# Patient Record
Sex: Male | Born: 1935 | Race: White | Hispanic: No | Marital: Married | Smoking: Former smoker
Health system: Southern US, Community
[De-identification: ages and names within clinical notes are randomized; demographics above are authoritative.]

## PROBLEM LIST (undated history)

## (undated) DIAGNOSIS — Z8719 Personal history of other diseases of the digestive system: Secondary | ICD-10-CM

## (undated) DIAGNOSIS — Z953 Presence of xenogenic heart valve: Secondary | ICD-10-CM

## (undated) DIAGNOSIS — F419 Anxiety disorder, unspecified: Secondary | ICD-10-CM

## (undated) DIAGNOSIS — I509 Heart failure, unspecified: Secondary | ICD-10-CM

## (undated) DIAGNOSIS — I350 Nonrheumatic aortic (valve) stenosis: Secondary | ICD-10-CM

## (undated) DIAGNOSIS — M1990 Unspecified osteoarthritis, unspecified site: Secondary | ICD-10-CM

## (undated) DIAGNOSIS — Z8711 Personal history of peptic ulcer disease: Secondary | ICD-10-CM

## (undated) DIAGNOSIS — R011 Cardiac murmur, unspecified: Secondary | ICD-10-CM

## (undated) DIAGNOSIS — K219 Gastro-esophageal reflux disease without esophagitis: Secondary | ICD-10-CM

## (undated) DIAGNOSIS — G709 Myoneural disorder, unspecified: Secondary | ICD-10-CM

## (undated) DIAGNOSIS — Z951 Presence of aortocoronary bypass graft: Secondary | ICD-10-CM

## (undated) DIAGNOSIS — Z8679 Personal history of other diseases of the circulatory system: Secondary | ICD-10-CM

## (undated) DIAGNOSIS — I10 Essential (primary) hypertension: Secondary | ICD-10-CM

## (undated) DIAGNOSIS — I428 Other cardiomyopathies: Secondary | ICD-10-CM

## (undated) DIAGNOSIS — Z9889 Other specified postprocedural states: Secondary | ICD-10-CM

## (undated) DIAGNOSIS — I5022 Chronic systolic (congestive) heart failure: Secondary | ICD-10-CM

## (undated) DIAGNOSIS — I4891 Unspecified atrial fibrillation: Secondary | ICD-10-CM

## (undated) DIAGNOSIS — I2510 Atherosclerotic heart disease of native coronary artery without angina pectoris: Secondary | ICD-10-CM

## (undated) DIAGNOSIS — C801 Malignant (primary) neoplasm, unspecified: Secondary | ICD-10-CM

## (undated) HISTORY — DX: Chronic systolic (congestive) heart failure: I50.22

## (undated) HISTORY — PX: OTHER SURGICAL HISTORY: SHX169

## (undated) HISTORY — DX: Personal history of other diseases of the digestive system: Z87.19

## (undated) HISTORY — PX: EYE SURGERY: SHX253

## (undated) HISTORY — DX: Personal history of peptic ulcer disease: Z87.11

## (undated) HISTORY — DX: Unspecified atrial fibrillation: I48.91

## (undated) HISTORY — DX: Other cardiomyopathies: I42.8

## (undated) HISTORY — DX: Nonrheumatic aortic (valve) stenosis: I35.0

## (undated) HISTORY — DX: Heart failure, unspecified: I50.9

## (undated) HISTORY — DX: Atherosclerotic heart disease of native coronary artery without angina pectoris: I25.10

## (undated) HISTORY — DX: Unspecified osteoarthritis, unspecified site: M19.90

## (undated) HISTORY — DX: Essential (primary) hypertension: I10

---

## 2004-12-07 ENCOUNTER — Ambulatory Visit: Payer: Self-pay | Admitting: Internal Medicine

## 2005-02-20 ENCOUNTER — Ambulatory Visit: Payer: Self-pay | Admitting: Gastroenterology

## 2006-12-26 ENCOUNTER — Ambulatory Visit: Payer: Self-pay | Admitting: Internal Medicine

## 2007-04-22 ENCOUNTER — Other Ambulatory Visit: Payer: Self-pay

## 2007-04-22 ENCOUNTER — Emergency Department: Payer: Self-pay | Admitting: Emergency Medicine

## 2007-04-25 ENCOUNTER — Ambulatory Visit: Payer: Self-pay | Admitting: Emergency Medicine

## 2007-04-30 ENCOUNTER — Ambulatory Visit: Payer: Self-pay | Admitting: Cardiology

## 2008-06-10 ENCOUNTER — Ambulatory Visit: Payer: Self-pay | Admitting: Internal Medicine

## 2009-10-19 ENCOUNTER — Ambulatory Visit: Payer: Self-pay | Admitting: Ophthalmology

## 2009-10-26 ENCOUNTER — Ambulatory Visit: Payer: Self-pay | Admitting: Ophthalmology

## 2010-03-28 ENCOUNTER — Ambulatory Visit: Payer: Self-pay | Admitting: Gastroenterology

## 2010-10-31 ENCOUNTER — Ambulatory Visit: Payer: Self-pay | Admitting: Urology

## 2011-07-04 ENCOUNTER — Ambulatory Visit: Payer: Self-pay | Admitting: Neurology

## 2011-11-22 ENCOUNTER — Inpatient Hospital Stay: Payer: Self-pay | Admitting: Cardiology

## 2011-11-22 LAB — BASIC METABOLIC PANEL
Anion Gap: 5 — ABNORMAL LOW (ref 7–16)
Calcium, Total: 8.3 mg/dL — ABNORMAL LOW (ref 8.5–10.1)
Creatinine: 1.12 mg/dL (ref 0.60–1.30)
EGFR (Non-African Amer.): >60
Glucose: 203 mg/dL — ABNORMAL HIGH (ref 65–99)
Osmolality: 291 (ref 275–301)
Potassium: 4.1 mmol/L (ref 3.5–5.1)

## 2011-11-22 LAB — CK TOTAL AND CKMB (NOT AT ARMC)
CK, Total: 67 U/L (ref 35–232)
CK, Total: 68 U/L (ref 35–232)

## 2011-11-22 LAB — TROPONIN I
Troponin-I: 0.03 ng/mL
Troponin-I: 0.03 ng/mL

## 2011-11-23 LAB — BASIC METABOLIC PANEL
Anion Gap: 9 (ref 7–16)
BUN: 15 mg/dL (ref 7–18)
EGFR (Non-African Amer.): >60
Osmolality: 286 (ref 275–301)

## 2011-11-24 LAB — CBC WITH DIFFERENTIAL/PLATELET
Basophil #: 0 10*3/uL (ref 0.0–0.1)
Basophil %: 0.4 %
Eosinophil %: 1.9 %
HCT: 35.3 % — ABNORMAL LOW (ref 40.0–52.0)
Lymphocyte %: 18.5 %
MCH: 30.4 pg (ref 26.0–34.0)
MCHC: 32.7 g/dL (ref 32.0–36.0)
Monocyte #: 0.9 x10 3/mm (ref 0.2–1.0)
Monocyte %: 11.4 %
Neutrophil #: 5.6 10*3/uL (ref 1.4–6.5)
RBC: 3.8 10*6/uL — ABNORMAL LOW (ref 4.40–5.90)

## 2011-11-24 LAB — BASIC METABOLIC PANEL
Anion Gap: 8 (ref 7–16)
Chloride: 105 mmol/L (ref 98–107)
EGFR (African American): >60
Glucose: 187 mg/dL — ABNORMAL HIGH (ref 65–99)
Potassium: 3.5 mmol/L (ref 3.5–5.1)

## 2011-11-25 LAB — BASIC METABOLIC PANEL
Anion Gap: 5 — ABNORMAL LOW (ref 7–16)
BUN: 21 mg/dL — ABNORMAL HIGH (ref 7–18)
Calcium, Total: 8.9 mg/dL (ref 8.5–10.1)
Chloride: 102 mmol/L (ref 98–107)
Creatinine: 1.25 mg/dL (ref 0.60–1.30)
Potassium: 4.2 mmol/L (ref 3.5–5.1)

## 2011-11-26 HISTORY — PX: CARDIAC CATHETERIZATION: SHX172

## 2011-11-26 LAB — BASIC METABOLIC PANEL
Calcium, Total: 9.2 mg/dL (ref 8.5–10.1)
Chloride: 102 mmol/L (ref 98–107)
Co2: 28 mmol/L (ref 21–32)
EGFR (Non-African Amer.): 48 — ABNORMAL LOW
Glucose: 224 mg/dL — ABNORMAL HIGH (ref 65–99)
Osmolality: 287 (ref 275–301)
Potassium: 4.3 mmol/L (ref 3.5–5.1)

## 2011-11-27 LAB — CBC WITH DIFFERENTIAL/PLATELET
Basophil %: 0.4 %
Eosinophil %: 2.6 %
HCT: 34.9 % — ABNORMAL LOW (ref 40.0–52.0)
Lymphocyte #: 1.6 10*3/uL (ref 1.0–3.6)
MCH: 30.4 pg (ref 26.0–34.0)
MCV: 93 fL (ref 80–100)
Monocyte %: 11.4 %
Neutrophil #: 4.1 10*3/uL (ref 1.4–6.5)
Neutrophil %: 61.6 %
RBC: 3.75 10*6/uL — ABNORMAL LOW (ref 4.40–5.90)
WBC: 6.7 10*3/uL (ref 3.8–10.6)

## 2011-11-27 LAB — BASIC METABOLIC PANEL
Co2: 27 mmol/L (ref 21–32)
Creatinine: 1.08 mg/dL (ref 0.60–1.30)
EGFR (African American): >60
EGFR (Non-African Amer.): >60
Glucose: 162 mg/dL — ABNORMAL HIGH (ref 65–99)
Potassium: 3.7 mmol/L (ref 3.5–5.1)
Sodium: 141 mmol/L (ref 136–145)

## 2012-02-05 ENCOUNTER — Encounter: Payer: Self-pay | Admitting: *Deleted

## 2012-02-05 ENCOUNTER — Ambulatory Visit (INDEPENDENT_AMBULATORY_CARE_PROVIDER_SITE_OTHER): Payer: Medicare Other | Admitting: Internal Medicine

## 2012-02-05 VITALS — BP 118/82 | HR 90 | Ht 72.0 in | Wt 256.0 lb

## 2012-02-05 DIAGNOSIS — I5022 Chronic systolic (congestive) heart failure: Secondary | ICD-10-CM

## 2012-02-05 DIAGNOSIS — I428 Other cardiomyopathies: Secondary | ICD-10-CM

## 2012-02-05 DIAGNOSIS — I4891 Unspecified atrial fibrillation: Secondary | ICD-10-CM

## 2012-02-05 DIAGNOSIS — I350 Nonrheumatic aortic (valve) stenosis: Secondary | ICD-10-CM | POA: Insufficient documentation

## 2012-02-05 DIAGNOSIS — I359 Nonrheumatic aortic valve disorder, unspecified: Secondary | ICD-10-CM

## 2012-02-05 MED ORDER — APIXABAN 5 MG PO TABS: 5.0000 mg | ORAL_TABLET | Freq: Two times a day (BID) | ORAL | Status: DC

## 2012-02-05 MED ORDER — DIGOXIN 125 MCG PO TABS: 0.1250 mg | ORAL_TABLET | Freq: Every day | ORAL | Status: DC

## 2012-02-05 MED ORDER — METOPROLOL SUCCINATE ER 100 MG PO TB24: ORAL_TABLET | ORAL | Status: DC

## 2012-02-05 NOTE — Assessment & Plan Note (Signed)
As above.

## 2012-02-05 NOTE — Assessment & Plan Note (Signed)
The patient has nonischemic cardiomyopathy for which is referred for an ICD. He has occurring congestive heart failure and has undergone extensive evaluation under the care of Dr. AP results of which include a number of potential contributors which we might be able to address prior to ICD implantation.  The first is that he has aortic stenosis which is quite severe with a mean gradient of 25 the context of an ejection fraction of 30-35. The estimated area catheterization 1.2 with a valve index of 0.5 the estimated valve area by echo was about 0.75 I don't know if he has low flow aortic stenosis.  I spoke with Dr. Excell Seltzer. He did not undertaken for stress to evaluate this because of rapid atrial fibrillation. He does recommend though in anticipation of the valve clinic ultrasound so he and the surgeon can review it directly.

## 2012-02-05 NOTE — Patient Instructions (Addendum)
Your physician has recommended you make the following change in your medication:  1. Start Apixaban 5 mg twice daily 2. Start Digoxin 0.125 mg daily 3. Increase Metoprolol succinate to 150 mg daily 4. Stop Aspirin once you start Apixaban  You have been referred to the valve clinic.  We will make appt for you and call you with appointment.  Your physician wants you to follow-up in 6 weeks with Dr. Graciela Husbands. You will receive a reminder letter in the mail two months in advance. If you don't receive a letter, please call our office to schedule the follow-up appointment.

## 2012-02-05 NOTE — Progress Notes (Signed)
ksw    History and Physical  Patient ID: Christopher Walker MRN: [id number], SOB: March 13, 1933 78 y.o. Date of Encounter: 02/05/2012, 6:02 PM  Primary Physician: Yates Decamp, MD Primary Cardiologist: ap Primary Electrophysiologist:  new  Chief Complaint:    History of Present Illness: Christopher Walker is a 78 y.o. male  seen for consideration of ICD implantation.  He has a history of a few years of irregular heartbeat. He has significantly symptomatic congestive heart failure with dyspnea at less than 100 feet without nocturnal dyspnea orthopnea or peripheral edema. He does not have palpitations. He does not have prior myocardial infarction.  Cardiac evaluation has included recently a Holter monitor which is outlined below with a mean heart rate of about 100 and higher than that during the day. Echocardiogram suggests moderate aortic stenosis with a mean gradient of 25 but he also has aortic insufficiency and an ejection fraction of 30-35%. Catheterization I think it may demonstrated a distal 90% lesion in the proximal 60% lesion in his LAD he had modest disease in his circumflex and his PDA was occluded in its distal portion but only minimally so proximally. Ejection fraction of 24%.   Past Medical History  Diagnosis Date  . Chronic systolic heart failure   . Hypertension   . Diabetes mellitus   . Coronary artery disease     Two vessel CAD; 60% stenosis proximal LAD, 90% stenosis distal LAD, occluded posterior descending artery, 50% stenosis mid left circumflex with LVF of 25%.  . Atrial fibrillation   . Aortic stenosis   . History of stomach ulcers   . Arthritis   . Nonischemic cardiomyopathy     With concomitant coronary disease     Past Surgical History  Procedure Date  . Cardiac catheterization 11/26/2011    No significant aortic stenosis by Cardiac Cath       Current Outpatient Prescriptions  Medication Sig Dispense Refill  . Calcium Carbonate-Vitamin D (OYSTER SHELL  CALCIUM 500 + D PO) Take 750 mg by mouth daily.      . furosemide (LASIX) 40 MG tablet Take by mouth. Take 20 mg every other day, alternating 40 mg every other day      . glimepiride (AMARYL) 2 MG tablet Take 2 mg by mouth daily before breakfast.      . metFORMIN (GLUCOPHAGE) 850 MG tablet Take 850 mg by mouth 2 (two) times daily with a meal.      . ramipril (ALTACE) 10 MG capsule Take 10 mg by mouth daily.      . simvastatin (ZOCOR) 40 MG tablet Take 40 mg by mouth every evening.      . traMADol-acetaminophen (ULTRACET) 37.5-325 MG per tablet Take 1 tablet by mouth every 6 (six) hours as needed.      . zinc gluconate 50 MG tablet Take 50 mg by mouth daily.      Marland Kitchen DISCONTD: metoprolol succinate (TOPROL-XL) 100 MG 24 hr tablet Take 100 mg by mouth daily. Take with or immediately following a meal.      . apixaban (ELIQUIS) 5 MG TABS tablet Take 1 tablet (5 mg total) by mouth 2 (two) times daily.  60 tablet  3  . digoxin (LANOXIN) 0.125 MG tablet Take 1 tablet (0.125 mg total) by mouth daily.  90 tablet  3  . metoprolol succinate (TOPROL XL) 100 MG 24 hr tablet Take 1 1/2 tablets daily  45 tablet  6  . DISCONTD: metoprolol succinate (TOPROL-XL) 25 MG 24 hr  tablet Take 25 mg by mouth daily.         Allergies: Allergies  Allergen Reactions  . Avandia (Rosiglitazone)      History  Substance Use Topics  . Smoking status: Former Smoker -- 10 years  . Smokeless tobacco: Not on file  . Alcohol Use: Not on file      Family History  Problem Relation Age of Onset  . Heart disease Brother     1/4  . Cancer Father   . Emphysema Sister     1/2  . Cancer Sister     2/2  . Hernia Brother     2/4  . Heart disease Brother     3/4      ROS:  Please see the history of present illness.     All other systems reviewed and negative.   Vital Signs: Blood pressure 118/82, pulse 90, height 6' (1.829 m), weight 256 lb (116.121 kg).  PHYSICAL EXAM: General:  Well nourished, well developed male  in no acute distress HEENT: normal Lymph: no adenopathy Neck: JVP was 8  Endocrine:  No thryomegaly Vascular: No carotid bruits; FA pulses 2+ bilaterally without bruitsCarotid upstrokes were delayed Cardiac:  Irregularly irregular rhythm with a 3/6 systolic murmur heard best at the apex S2 is single Back: without kyphosis/scoliosis, no CVA tenderness Lungs:  clear to auscultation bilaterally, no wheezing, rhonchi or rales Abd: soft, nontender, no hepatomegaly Ext: Trace edema edema Musculoskeletal:  No deformities, BUE and BLE strength normal and equal Skin: warm and dry Neuro:  CNs 2-12 intact, no focal abnormalities noted Psych:  Normal affect   EKG:  Atrial fibrillation at 90 Intervals-/10/38 Lateral ST T changes  Labs:   No results found for this basename: WBC,  HGB,  HCT,  MCV,  PLT   No results found for this basename: NA,K,CL,CO2,BUN,CREATININE,CALCIUM,LABALBU,PROT,BILITOT,ALKPHOS,ALT,AST,GLUCOSE in the last 168 hours No results found for this basename: CKTOTAL:4,CKMB:4,TROPONINI:4 in the last 72 hours No results found for this basename: CHOL,  HDL,  LDLCALC,  TRIG   No results found for this basename: DDIMER   BNP No results found for this basename: probnp       ASSESSMENT AND PLAN:

## 2012-02-05 NOTE — Assessment & Plan Note (Signed)
Atrial fibrillation is the second potential target for symptomatic heart failure with his. His resting rate is about 80 during the night. During the day his mean is about 110 with excursions to the 120-130 range.  Augment rate control may have an impact improving left ventricular function as well as improving cardiac performance. To that and we'll increase his metoprolol from 100-150 mg a day as well as add digoxin 0.125 mg. I should note that his creatinine last month was 1.3.  He also needs anticoagulation as his CHADS-VASc score is 5+. He has had problems with epistaxis on aspirin. I've asked that he followup with ENT and have bedrest that we begin him on apixoban Which hopefully he will tolerate. The appropriate dose for his 5 mg twice daily. His left atrial dimension is surprisingly small 4.7. I would recommend that we undertake cardioversion following initiation of anticoagulation. I have reviewed this with Dr. Wendi Maya. He is in agreement.

## 2012-02-06 ENCOUNTER — Telehealth: Payer: Self-pay | Admitting: Internal Medicine

## 2012-02-06 ENCOUNTER — Telehealth: Payer: Self-pay

## 2012-02-06 MED ORDER — DIGOXIN 125 MCG PO TABS: 0.1250 mg | ORAL_TABLET | Freq: Every day | ORAL | Status: DC

## 2012-02-06 MED ORDER — APIXABAN 5 MG PO TABS: 5.0000 mg | ORAL_TABLET | Freq: Two times a day (BID) | ORAL | Status: DC

## 2012-02-06 MED ORDER — METOPROLOL SUCCINATE ER 100 MG PO TB24: ORAL_TABLET | ORAL | Status: DC

## 2012-02-06 NOTE — Telephone Encounter (Signed)
Patient called stated pharmacy did not have prescriptions that were called in yesterday.Patient was told will send to [facility] [address] digoxin,apixaban,metoprolol.

## 2012-02-06 NOTE — Telephone Encounter (Signed)
Dr. Graciela Husbands called to say he spoke with Dr. Excell Seltzer about pt and Dr. Excell Seltzer would like pt to have an echocardiogram prior to being seen in valve clinic.  Will call to schedule pt.

## 2012-02-06 NOTE — Telephone Encounter (Signed)
Per spouse pt was to be put on digixon but Pharm still dosen't have Rx and he needs a Rx on his metoprolol because it was increased please call to [facility] on [address] in 

## 2012-02-07 ENCOUNTER — Other Ambulatory Visit: Payer: Self-pay

## 2012-02-07 DIAGNOSIS — I350 Nonrheumatic aortic (valve) stenosis: Secondary | ICD-10-CM

## 2012-02-13 ENCOUNTER — Other Ambulatory Visit: Payer: Self-pay

## 2012-02-13 ENCOUNTER — Other Ambulatory Visit (INDEPENDENT_AMBULATORY_CARE_PROVIDER_SITE_OTHER): Payer: Medicare Other

## 2012-02-13 DIAGNOSIS — I359 Nonrheumatic aortic valve disorder, unspecified: Secondary | ICD-10-CM

## 2012-02-13 DIAGNOSIS — I350 Nonrheumatic aortic (valve) stenosis: Secondary | ICD-10-CM

## 2012-02-15 ENCOUNTER — Other Ambulatory Visit: Payer: Self-pay | Admitting: *Deleted

## 2012-02-15 ENCOUNTER — Telehealth: Payer: Self-pay

## 2012-02-15 ENCOUNTER — Telehealth: Payer: Self-pay | Admitting: *Deleted

## 2012-02-15 DIAGNOSIS — I359 Nonrheumatic aortic valve disorder, unspecified: Secondary | ICD-10-CM

## 2012-02-15 NOTE — Telephone Encounter (Signed)
Message copied by Marcelle Overlie on Fri Feb 15, 2012  9:01 AM ------      Message from: Thersa Salt      Created: Fri Feb 15, 2012  8:49 AM      Regarding: RE: cooper       Darius Bump from [facility] will be call pt for an appt.       ----- Message -----         From: Marcelle Overlie, RN         Sent: 02/14/2012   8:11 AM           To: Ted Mcalpine Little, #      Subject: cooper                                                   Pt needs appt with Dr. Excell Seltzer per Dr. Graciela Husbands for valve issues. Thanks!

## 2012-02-15 NOTE — Telephone Encounter (Signed)
Great.  Thanks

## 2012-02-15 NOTE — Telephone Encounter (Signed)
Called patient and spoke with his wife about upcoming appt in valve clinic on Friday August 23rd at 3:00pm.  Addressed all questions.  Will continue to follow up with patient.

## 2012-02-19 ENCOUNTER — Other Ambulatory Visit: Payer: Medicare Other

## 2012-02-21 ENCOUNTER — Ambulatory Visit (INDEPENDENT_AMBULATORY_CARE_PROVIDER_SITE_OTHER): Payer: Medicare Other | Admitting: Cardiovascular Disease

## 2012-02-21 ENCOUNTER — Encounter: Payer: Self-pay | Admitting: Cardiovascular Disease

## 2012-02-21 VITALS — BP 122/80 | HR 85 | Ht 72.0 in | Wt 256.0 lb

## 2012-02-21 DIAGNOSIS — I359 Nonrheumatic aortic valve disorder, unspecified: Secondary | ICD-10-CM

## 2012-02-21 DIAGNOSIS — R5381 Other malaise: Secondary | ICD-10-CM

## 2012-02-21 DIAGNOSIS — I4891 Unspecified atrial fibrillation: Secondary | ICD-10-CM

## 2012-02-21 DIAGNOSIS — I5022 Chronic systolic (congestive) heart failure: Secondary | ICD-10-CM

## 2012-02-21 DIAGNOSIS — R6889 Other general symptoms and signs: Secondary | ICD-10-CM

## 2012-02-21 NOTE — Assessment & Plan Note (Signed)
The patient continues to be in atrial fibrillation. It appears that he is feeling worse since the dose of Toprol was increased and the addition of small dose digoxin. It is possible that he might be relying on a faster heart rate to maintain cardiac output due to his cardiomyopathy and aortic stenosis. Thus, I will decrease Toprol back to 100 mg once daily. Continue anticoagulation. The plan is to attempt cardioversion after a minimum of 3 weeks anticoagulation.

## 2012-02-21 NOTE — Assessment & Plan Note (Addendum)
The patient has aortic stenosis which is at least moderate. He had an echocardiogram done which was reviewed by me. LV systolic function appears to be around 35-40%. The aortic valve was heavily calcified with significantly restricted opening. The mean gradient was 29 mm mercury with a valve area of 0.8 cm. There was mild to moderate aortic insufficiency. Systolic PA pressure was 42 mmHg. Based on the morphology of the valve, I suspect that the patient has severe aortic stenosis. I think proving that might be somewhat difficult due to his underlying rhythm of atrial fibrillation. He will need a dobutamine echo. It would be difficult to give dobutamine. However, this can be attempted in a controlled setting with the possibility of reversing the effect of dobutamine with metoprolol at the end of the study. The alternative would be to wait until the cardioversion and see if he maintains in sinus rhythm.  He already has a followup appointment scheduled at the valve clinic. I discussed the case today with his cardiologist Dr. Darrold Junker.

## 2012-02-21 NOTE — Patient Instructions (Addendum)
Decrease Metoprolol ER to 100 mg once daily.  Keep your appointment with valve clinic.

## 2012-02-21 NOTE — Assessment & Plan Note (Signed)
He seems to have significant dyspnea and symptoms suggestive of low cardiac output.

## 2012-02-21 NOTE — Progress Notes (Signed)
HPI  Christopher Walker is a 78 y.o. male who is a patient of Dr. Darrold Junker and was recently evaluated by Dr. Graciela Husbands for consideration of ICD implantation. He showed up to our clinic today without an appointment and requested to be seen because he was not feeling well. He has a history of chronic atrial fibrillation, chronic systolic heart failure with moderately to severely reduced LV systolic function as well as moderate and possibly severe aortic stenosis. He underwent cardiac catheterization recently which showed no obstructive disease in the main arteries. There was 60% proximal LAD stenosis, 90% distal LAD stenosis and mild to moderate LCx/RCA disease. The right PDA was occluded. Ejection fraction was 28%. Aortic valve area was 1.2 cm. He had a followup echocardiogram at [facility] which showed an ejection fraction of 30-35% with moderate to severe aortic stenosis. Mean gradient was 25 with a calculated valve area of 0.8.  He has significantly symptomatic congestive heart failure with dyspnea at less than 100 feet without nocturnal dyspnea orthopnea or peripheral edema. He does not have palpitations. He does not have prior myocardial infarction.  During his last visit, the dose of Toprol was increased to 150 mg once daily. Since then, he has been feeling worse with increased fatigue. He woke up today and felt slightly dizzy and clammy. His symptoms improved in the office after he rested. He denies chest pain.  Allergies  Allergen Reactions  . Avandia (Rosiglitazone)      Current Outpatient Prescriptions on File Prior to Visit  Medication Sig Dispense Refill  . apixaban (ELIQUIS) 5 MG TABS tablet Take 1 tablet (5 mg total) by mouth 2 (two) times daily.  90 tablet  3  . Calcium Carbonate-Vitamin D (OYSTER SHELL CALCIUM 500 + D PO) Take 750 mg by mouth daily.      . digoxin (LANOXIN) 0.125 MG tablet Take 1 tablet (0.125 mg total) by mouth daily.  90 tablet  3  . furosemide (LASIX) 40 MG tablet Take by  mouth. Take 20 mg every other day, alternating 40 mg every other day      . glimepiride (AMARYL) 2 MG tablet Take 2 mg by mouth daily before breakfast.      . metFORMIN (GLUCOPHAGE) 850 MG tablet Take 850 mg by mouth 2 (two) times daily with a meal.      . metoprolol succinate (TOPROL XL) 100 MG 24 hr tablet Take 1 1/2 tablets daily  135 tablet  3  . ramipril (ALTACE) 10 MG capsule Take 10 mg by mouth daily.      . simvastatin (ZOCOR) 40 MG tablet Take 40 mg by mouth every evening.      . traMADol-acetaminophen (ULTRACET) 37.5-325 MG per tablet Take 1 tablet by mouth every 6 (six) hours as needed.      . zinc gluconate 50 MG tablet Take 50 mg by mouth daily.         Past Medical History  Diagnosis Date  . Chronic systolic heart failure   . Hypertension   . Diabetes mellitus   . Coronary artery disease     Two vessel CAD; 60% stenosis proximal LAD, 90% stenosis distal LAD, occluded posterior descending artery, 50% stenosis mid left circumflex with LVF of 25%.  . Atrial fibrillation   . Aortic stenosis   . History of stomach ulcers   . Arthritis   . Nonischemic cardiomyopathy     With concomitant coronary disease     Past Surgical History  Procedure  Date  . Cardiac catheterization 11/26/2011    No significant aortic stenosis by Cardiac Cath      Family History  Problem Relation Age of Onset  . Heart disease Brother     1/4  . Cancer Father   . Emphysema Sister     1/2  . Cancer Sister     2/2  . Hernia Brother     2/4  . Heart disease Brother     3/4     History   Social History  . Marital Status: Married    Spouse Name: N/A    Number of Children: N/A  . Years of Education: N/A   Occupational History  . Not on file.   Social History Main Topics  . Smoking status: Former Smoker -- 10 years  . Smokeless tobacco: Not on file  . Alcohol Use: Not on file  . Drug Use: Not on file  . Sexually Active: Not on file   Other Topics Concern  . Not on file    Social History Narrative  . No narrative on file     PHYSICAL EXAM   BP 122/80  Pulse 85  Ht 6' (1.829 m)  Wt 256 lb (116.121 kg)  BMI 34.72 kg/m2  Constitutional: He is oriented to person, place, and time. He appears well-developed and well-nourished. No distress.  HENT: No nasal discharge.  Head: Normocephalic and atraumatic.  Eyes: Pupils are equal and round. Right eye exhibits no discharge. Left eye exhibits no discharge.  Neck: Normal range of motion. Neck supple. No JVD present. No thyromegaly present.  Cardiovascular: Normal rate, irregular rhythm, diminished S2. Exam reveals no gallop and no friction rub. There is a 2/6 systolic ejection murmur at the aortic area which is late peaking. Pulmonary/Chest: Effort normal and breath sounds normal. No stridor. No respiratory distress. He has no wheezes. He has no rales. He exhibits no tenderness.  Abdominal: Soft. Bowel sounds are normal. He exhibits no distension. There is no tenderness. There is no rebound and no guarding.  Musculoskeletal: Normal range of motion. He exhibits no edema and no tenderness.  Neurological: He is alert and oriented to person, place, and time. Coordination normal.  Skin: Skin is warm and dry. No rash noted. He is not diaphoretic. No erythema. No pallor.  Psychiatric: He has a normal mood and affect. His behavior is normal. Judgment and thought content normal.      EKG: Atrial fibrillation with a ventricular rate of 85 beats per minute. Nonspecific T wave changes.   ASSESSMENT AND PLAN

## 2012-02-27 ENCOUNTER — Encounter: Payer: Self-pay | Admitting: Cardiovascular Disease

## 2012-02-29 ENCOUNTER — Telehealth: Payer: Self-pay | Admitting: *Deleted

## 2012-02-29 ENCOUNTER — Telehealth: Payer: Self-pay | Admitting: Internal Medicine

## 2012-02-29 NOTE — Telephone Encounter (Signed)
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 Will forward the call to the [place] office.

## 2012-02-29 NOTE — Telephone Encounter (Signed)
LMTCB. Pt has been approved for Apixaban (Eliquis) 5 mg from 02/29/2012 thru 02/28/2013.

## 2012-02-29 NOTE — Telephone Encounter (Signed)
Pt's wife says pharmacy called her earlier to say they approve medication but it will cost 95$/month. Pt cannot afford this and asks if there is something else to take. I explained Dr. Graciela Husbands is on vacation until Monday so we will need pt to stay on med at least until that time. Wife confirms pt still has enough samples to last him through weekend but she will pick up samples Monday for pt.  In the meantime, I will send note to Dr. Graciela Husbands to see if there is an alternative for pt that would be more cost effective.  Understanding verb.

## 2012-02-29 NOTE — Telephone Encounter (Signed)
Please see below and advise. Thanks

## 2012-02-29 NOTE — Telephone Encounter (Signed)
New Problem:    Patient's wife called in because the patient's insurance refused to approve the prescription for the patient to receive apixaban (ELIQUIS) 5 MG TABS tablet and was wondering how to proceed.  Please call back.

## 2012-03-03 NOTE — Telephone Encounter (Signed)
If we can give him samples to get three weeks pre and 4 weeks post for apixoban that would be great because we can at least get the afib question answered , that is if he is still taking it, otherwise we can talk to [facility] aobut starting coumadin\  Thanks steve

## 2012-03-04 ENCOUNTER — Other Ambulatory Visit: Payer: Self-pay | Admitting: *Deleted

## 2012-03-04 ENCOUNTER — Telehealth: Payer: Self-pay | Admitting: Cardiovascular Disease

## 2012-03-04 NOTE — Telephone Encounter (Signed)
Will forward to MD for review

## 2012-03-04 NOTE — Telephone Encounter (Signed)
Pt was notified and states he picked up samples yesterday.  He will call when he needs more samples.

## 2012-03-04 NOTE — Telephone Encounter (Signed)
This pt is followed by Dr Graciela Husbands for Atrial Fibrillation and Metoprolol was increased by Dr Graciela Husbands.  I will forward this message to Sherri Rad RN to contact the patient.

## 2012-03-04 NOTE — Telephone Encounter (Signed)
Pt having a bad case of diarrhea since starting, metoprolol, pls call [phone]

## 2012-03-04 NOTE — Telephone Encounter (Signed)
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 Will forward to [facility] as well.

## 2012-03-06 NOTE — Telephone Encounter (Signed)
Lmtcb. Debbie Hollye Pritt RN  

## 2012-03-07 NOTE — Telephone Encounter (Signed)
Pt reports his diarrhea has improved since taking OTC Immodium. He has not had any diarrhea today. Reassurance given.  Mylo Red RN

## 2012-03-10 ENCOUNTER — Ambulatory Visit (INDEPENDENT_AMBULATORY_CARE_PROVIDER_SITE_OTHER): Payer: Medicare Other | Admitting: Internal Medicine

## 2012-03-10 ENCOUNTER — Encounter: Payer: Self-pay | Admitting: Internal Medicine

## 2012-03-10 ENCOUNTER — Telehealth: Payer: Self-pay | Admitting: *Deleted

## 2012-03-10 VITALS — BP 152/80 | HR 73 | Ht 72.0 in | Wt 261.2 lb

## 2012-03-10 DIAGNOSIS — I4891 Unspecified atrial fibrillation: Secondary | ICD-10-CM

## 2012-03-10 DIAGNOSIS — I428 Other cardiomyopathies: Secondary | ICD-10-CM

## 2012-03-10 DIAGNOSIS — R0602 Shortness of breath: Secondary | ICD-10-CM

## 2012-03-10 DIAGNOSIS — I359 Nonrheumatic aortic valve disorder, unspecified: Secondary | ICD-10-CM

## 2012-03-10 DIAGNOSIS — I350 Nonrheumatic aortic (valve) stenosis: Secondary | ICD-10-CM

## 2012-03-10 NOTE — Assessment & Plan Note (Signed)
As above.

## 2012-03-10 NOTE — Progress Notes (Signed)
f  HPI  Christopher Walker is a 78 y.o. male Seen following initial evaluation for an ICD. At that time it was noted that he had aortic stenosis with poor left ventricular function and the question was did he have a low gradient aortic stenosis. He was to be seen in the valve clinic.  He also had atrial fibrillation with a relatively rapid rate. Anticoagulation was to be initiated following evaluation by ENT. Metoprolol was increased and he saw Dr. Marcheta Grammes about 2 weeks ago because of fatigue and dizziness  He is no better symptom wise  He is scheduled to go to valve clinic on Friday re AS   He is chronically fatigued but does not sleep well  His wife denies his snoring  He had some pain with the initiation of his apixaban but after stopping it for a day or so at the end of July he is unable to take it with less bleeding even then he had on his aspirin.  Past Medical History  Diagnosis Date  . Chronic systolic heart failure   . Hypertension   . Diabetes mellitus   . Coronary artery disease     Two vessel CAD; 60% stenosis proximal LAD, 90% stenosis distal LAD, occluded posterior descending artery, 50% stenosis mid left circumflex with LVF of 25%.  . Atrial fibrillation   . Aortic stenosis   . History of stomach ulcers   . Arthritis   . Nonischemic cardiomyopathy     With concomitant coronary disease    Past Surgical History  Procedure Date  . Cardiac catheterization 11/26/2011    No significant aortic stenosis by Cardiac Cath     Current Outpatient Prescriptions  Medication Sig Dispense Refill  . apixaban (ELIQUIS) 5 MG TABS tablet Take 1 tablet (5 mg total) by mouth 2 (two) times daily.  90 tablet  3  . digoxin (LANOXIN) 0.125 MG tablet Take 1 tablet (0.125 mg total) by mouth daily.  90 tablet  3  . furosemide (LASIX) 40 MG tablet Take by mouth. Take 20 mg every other day, alternating 40 mg every other day      . glimepiride (AMARYL) 2 MG tablet Take 2 mg by mouth 2 (two) times daily.        . metFORMIN (GLUCOPHAGE) 850 MG tablet Take 850 mg by mouth 2 (two) times daily with a meal.      . metoprolol succinate (TOPROL-XL) 100 MG 24 hr tablet Take 100 mg by mouth daily.      . ramipril (ALTACE) 10 MG capsule Take 10 mg by mouth daily.      . simvastatin (ZOCOR) 40 MG tablet Take 40 mg by mouth every evening.      . traMADol-acetaminophen (ULTRACET) 37.5-325 MG per tablet Take 1 tablet by mouth every 6 (six) hours as needed.      . zinc gluconate 50 MG tablet Take 50 mg by mouth daily.      Marland Kitchen DISCONTD: metoprolol succinate (TOPROL XL) 100 MG 24 hr tablet Take 1 1/2 tablets daily  135 tablet  3    Allergies  Allergen Reactions  . Avandia (Rosiglitazone)     Review of Systems negative except from HPI and PMH  Physical Exam BP 152/80  Pulse 73  Ht 6' (1.829 m)  Wt 261 lb 4 oz (118.502 kg)  BMI 35.43 kg/m2 Well developed and well nourished in no acute distress HENT normal E scleral and icterus clear Neck Supple JVP flat; carotids delayed  Clear to ausculation irregularly irregular there is a 2-3/6 high-pitched murmur crescendo decrescendo Soft with active bowel sounds No clubbing cyanosis nonpitting brawny Edema Alert and oriented, grossly normal motor and sensory function Skin Warm and Dry  Atrial fibrillation at 73 Intervals-/10/37 with ST-T changes inferolaterally  Assessment and  Plan

## 2012-03-10 NOTE — Assessment & Plan Note (Signed)
Continue current meds;  He may be a candidate for aldactone

## 2012-03-10 NOTE — Patient Instructions (Addendum)
Your physician wants you to follow-up in: 8 weeks with Dr. Graciela Husbands. You will receive a reminder letter in the mail two months in advance. If you don't receive a letter, please call our office to schedule the follow-up appointment.  Your physician recommends that you have lab work today

## 2012-03-10 NOTE — Assessment & Plan Note (Signed)
He has persistent atrial fibrillation. He has been on apixaban for more than 3 weeks. I have been in contact with Dr. AP about proceeding with cardioversion next week. Furthermore we'll check his digoxin level today. Hopefully the restoration of sinus rhythm with a stiff left ventricle will improve cardiac function and reduce his symptoms

## 2012-03-10 NOTE — Telephone Encounter (Signed)
Left a message for Christopher Walker to confirm upcoming TAVR clinic appt on 03/14/12 at 3pm to see Dr. Cornelius Moras and Dr. Excell Seltzer.  Will continue to follow up as needed.

## 2012-03-11 ENCOUNTER — Telehealth: Payer: Self-pay | Admitting: Internal Medicine

## 2012-03-11 LAB — BASIC METABOLIC PANEL
BUN: 15 mg/dL (ref 8–27)
Calcium: 9 mg/dL (ref 8.6–10.2)
Chloride: 103 mmol/L (ref 97–108)
GFR calc Af Amer: 80 mL/min/{1.73_m2} (ref >59)
GFR calc non Af Amer: 69 mL/min/{1.73_m2} (ref >59)
Glucose: 116 mg/dL — ABNORMAL HIGH (ref 65–99)

## 2012-03-11 LAB — DIGOXIN LEVEL: Digoxin Level: 0.7 ng/mL — ABNORMAL LOW (ref 0.9–2.0)

## 2012-03-11 NOTE — Telephone Encounter (Signed)
New msg Pt called back he said he only takes one tablet daily of glimepiride instead of two.

## 2012-03-11 NOTE — Telephone Encounter (Signed)
Medication correction 

## 2012-03-11 NOTE — Telephone Encounter (Signed)
[facility] patient. 

## 2012-03-14 ENCOUNTER — Encounter (HOSPITAL_COMMUNITY): Payer: Self-pay | Admitting: Cardiovascular Disease

## 2012-03-14 ENCOUNTER — Ambulatory Visit (HOSPITAL_COMMUNITY)
Admission: RE | Admit: 2012-03-14 | Discharge: 2012-03-14 | Disposition: A | Discharge status: Home / Self Care | Payer: Medicare Other | Source: Ambulatory Visit | Attending: Cardiovascular Disease | Admitting: Cardiovascular Disease

## 2012-03-14 ENCOUNTER — Encounter (HOSPITAL_COMMUNITY): Payer: Self-pay | Admitting: Thoracic Surgery (Cardiothoracic Vascular Surgery)

## 2012-03-14 ENCOUNTER — Ambulatory Visit (HOSPITAL_BASED_OUTPATIENT_CLINIC_OR_DEPARTMENT_OTHER)
Admission: RE | Admit: 2012-03-14 | Discharge: 2012-03-14 | Disposition: A | Discharge status: Home / Self Care | Payer: Medicare Other | Source: Ambulatory Visit | Attending: Thoracic Surgery (Cardiothoracic Vascular Surgery) | Admitting: Thoracic Surgery (Cardiothoracic Vascular Surgery)

## 2012-03-14 VITALS — BP 117/67 | HR 84 | Resp 18 | Ht 72.0 in | Wt 254.4 lb

## 2012-03-14 DIAGNOSIS — I359 Nonrheumatic aortic valve disorder, unspecified: Secondary | ICD-10-CM

## 2012-03-14 DIAGNOSIS — I350 Nonrheumatic aortic (valve) stenosis: Secondary | ICD-10-CM

## 2012-03-14 DIAGNOSIS — R059 Cough, unspecified: Secondary | ICD-10-CM | POA: Insufficient documentation

## 2012-03-14 DIAGNOSIS — R0609 Other forms of dyspnea: Secondary | ICD-10-CM | POA: Insufficient documentation

## 2012-03-14 DIAGNOSIS — R05 Cough: Secondary | ICD-10-CM | POA: Insufficient documentation

## 2012-03-14 DIAGNOSIS — Z01818 Encounter for other preprocedural examination: Secondary | ICD-10-CM | POA: Insufficient documentation

## 2012-03-14 DIAGNOSIS — I2510 Atherosclerotic heart disease of native coronary artery without angina pectoris: Secondary | ICD-10-CM | POA: Insufficient documentation

## 2012-03-14 DIAGNOSIS — R0989 Other specified symptoms and signs involving the circulatory and respiratory systems: Secondary | ICD-10-CM | POA: Insufficient documentation

## 2012-03-14 LAB — PULMONARY FUNCTION TEST

## 2012-03-14 NOTE — Progress Notes (Signed)
Pulmonary Function Tests Pre-Bronch Baseline        FVC  3.76 L  (87% predicted)  FEV1  3.17 L  (102% predicted)  FEF25-75 4.02 L  (182% predicted)   RV  2.86 L  (107% predicted) DLCO  57% predicted  NO POST-BRONCH RESULTS  6 Minute Walk Test Results  Patient: Christopher Walker Date:  03/14/2012   Supplemental O2 during test? no      Baseline   End  Time   1340    1346 Heartrate  84    86 Dyspnea  no    yes Fatigue  no    yes O2 sat   97%    98% Blood pressure 117/67    134/74   Patient ambulated at a moderate pace for a total distance of 840 feet with 4 stops.  Ambulation was limited primarily due to shortness of breath.  Overall the test was tolerated well.  STS Risk Calculator  Procedure    TAVR  Risk of Mortality   2.724% Morbidity or Mortality  19.088% Prolonged LOS   7.757% Short LOS    26.747% Permanent Stroke   1.306% Prolonged Vent Support  12.320% DSW Infection    0.597% Renal Failure    5.705% Reoperation    7.363%    Review of Systems:   General:  NO loss of appetite, YES energy, YES weight gain, NO weight loss, NO fever  Cardiac:  NO chest pain with exertion, NO chest pain at rest, YES SOB with exertion, NO resting SOB, NO PND, NO orthopnea, NO palpitations, NO      arrhythmia, NO atrial fibrillation, YES LE edema, NO dizzy spells, NO syncope  Respiratory:  YES shortness of breath, NO home oxygen, NO productive cough, NO dry cough, NO bronchitis, NO wheezing, NO hemoptysis, NO asthma,      NO pain with inspiration or cough, NO sleep apnea, NO CPAP at night  GI:   NO difficulty swallowing, YES reflux, YES frequent heartburn, YES hiatal hernia, NO abdominal pain, NO constipation, NO diarrhea, NO      hematochezia, NO hematemesis, NO melena  GU:   NO dysuria,  YES frequency, NO urinary tract infection, NO hematuria, NO enlarged prostate, NO kidney stones, NO kidney disease  Vascular:  NO pain suggestive of claudication, NO pain in feet, YES leg  cramps, NO varicose veins, NO DVT, NO non-healing foot ulcer  Neuro:   NO stroke, NO TIA's, NO seizures, NO headaches, NOtemporary blindness one eye,  NO slurred speech, NO peripheral neuropathy, YES      chronic pain, NO instability of gait, NO memory/cognitive dysfunction  Musculoskeletal: YES arthritis, NO joint swelling, NO myalgias, NO difficulty walking, NO mobility   Skin:   NO rash, NO itching, NO skin infections, NO pressure sores or ulcerations  Psych:   NO anxiety, NO depression, YES nervousness, NO unusual recent stress  Eyes:   NO blurry vision, NO floaters, YES recent vision changes, YES wears glasses or contacts  ENT:   NO hearing loss, NO loose or painful teeth, YES dentures, last saw dentist MAY 2013  Hematologic:  YES easy bruising, NO abnormal bleeding, NOclotting disorder, NO frequent epistaxis  Endocrine:  YES diabetes, CHECKS BLOOD SUGARS EVERY MORNING.

## 2012-03-14 NOTE — Progress Notes (Addendum)
MULTIDISCIPLINARY HEART VALVE CLINIC NOTE  Patient ID: Christopher Walker MRN: [id number] DOB/AGE: 1932/07/30 78 y.o.  Primary Care Physician:WALKER, Jonny Ruiz, MD Primary Cardiologist: Jamse Mead, MD Referring Physician: Berton Mount, MD  HPI: 78 year old gentleman referred for evaluation of severe aortic stenosis. The patient reports a declining functional capacity over several years. He describes progressive weakness, generalized fatigue, and shortness of breath over this time period. He has not had chest pain or pressure, lightheadedness, or syncope.  Approximately 3 months ago, the patient was evaluated with edema, resting shortness of breath, and associated heart failure symptoms. He required hospital admission and IV diuresis. After he demonstrated clinical improvement he underwent right and left heart catheterization. This demonstrated severe global left ventricular dysfunction with a left ventricular ejection fraction of less than 30%. He was noted to have obstructive coronary artery disease with diffusely calcified coronary arteries, nonobstructive disease of the right coronary artery and left circumflex vessels, total occlusion of the right PDA, and severe mid and distal stenosis of the LAD. The PDA was collateralized by septal perforators of the LAD.  The patient was later referred to Dr. Graciela Husbands for consideration of ICD implantation. His rate controlling medications were increased and he underwent a repeat echocardiogram. This demonstrated improvement in overall LV function with an ejection fraction of 40-45%. The mean transaortic valvular gradient was 29 mmHg the peak gradient was 51 mm mercury.  The patient has a multitude of other symptoms, including the proximal leg weakness. He has a difficult time getting up from a chair. He complains of an intention tremor that is worsening in his hands. He also has difficulty with gait instability. He has not had frequent falls. He has undergone  neurologic evaluation but a definitive diagnosis was not arrived upon.  Past Medical History  Diagnosis Date  . Chronic systolic heart failure   . Hypertension   . Diabetes mellitus   . Coronary artery disease     Two vessel CAD; 60% stenosis proximal LAD, 90% stenosis distal LAD, occluded posterior descending artery, 50% stenosis mid left circumflex with LVF of 25%.  . Atrial fibrillation   . Aortic stenosis   . History of stomach ulcers   . Arthritis   . Nonischemic cardiomyopathy     With concomitant coronary disease  . CHF (congestive heart failure)     Past Surgical History  Procedure Date  . Cardiac catheterization 11/26/2011    No significant aortic stenosis by Cardiac Cath     Family History  Problem Relation Age of Onset  . Heart disease Brother     1/4  . Cancer Father   . Emphysema Sister     1/2  . Cancer Sister     2/2  . Hernia Brother     2/4  . Heart disease Brother     3/4    History   Social History  . Marital Status: Married    Spouse Name: N/A    Number of Children: N/A  . Years of Education: N/A   Occupational History  . Not on file.   Social History Main Topics  . Smoking status: Former Smoker -- 1.5 packs/day for 10 years    Quit date: 06/23/1964  . Smokeless tobacco: Not on file  . Alcohol Use: No  . Drug Use: No  . Sexually Active: Not on file   Other Topics Concern  . Not on file   Social History Narrative  . No narrative on file    Meds: apixaban,  digoxin, furosemide, glimepride, metformin, metoprolol succinate, ramipril, simvastatin, tramadol, and zinc.  Allergies  Allergen Reactions  . Avandia (Rosiglitazone)    Review of Systems:  General: NO loss of appetite, YES energy, YES weight gain, NO weight loss, NO fever  Cardiac: NO chest pain with exertion, NO chest pain at rest, YES SOB with exertion, NO resting SOB, NO PND, NO orthopnea, NO palpitations, NO arrhythmia, NO atrial fibrillation, YES LE edema, NO dizzy spells,  NO syncope  Respiratory: YES shortness of breath, NO home oxygen, NO productive cough, NO dry cough, NO bronchitis, NO wheezing, NO hemoptysis, NO asthma, NO pain with inspiration or cough, NO sleep apnea, NO CPAP at night  GI: NO difficulty swallowing, YES reflux, YES frequent heartburn, YES hiatal hernia, NO abdominal pain, NO constipation, NO diarrhea, NO hematochezia, NO hematemesis, NO melena  GU: NO dysuria, YES frequency, NO urinary tract infection, NO hematuria, NO enlarged prostate, NO kidney stones, NO kidney disease  Vascular: NO pain suggestive of claudication, NO pain in feet, YES leg cramps, NO varicose veins, NO DVT, NO non-healing foot ulcer  Neuro: NO stroke, NO TIA's, NO seizures, NO headaches, NOtemporary blindness one eye, NO slurred speech, NO peripheral neuropathy, YES chronic pain, NO instability of gait, NO memory/cognitive dysfunction  Musculoskeletal: YES arthritis, NO joint swelling, NO myalgias, NO difficulty walking, NO mobility  Skin: NO rash, NO itching, NO skin infections, NO pressure sores or ulcerations  Psych: NO anxiety, NO depression, YES nervousness, NO unusual recent stress  Eyes: NO blurry vision, NO floaters, YES recent vision changes, YES wears glasses or contacts  ENT: NO hearing loss, NO loose or painful teeth, YES dentures, last saw dentist MAY 2013  Hematologic: YES easy bruising, NO abnormal bleeding, NOclotting disorder, NO frequent epistaxis  Endocrine: YES diabetes, CHECKS BLOOD SUGARS EVERY MORNING.  BP 117/67  Pulse 84  Resp 18  Ht 6' (1.829 m)  Wt 254 lb 6.4 oz (115.395 kg)  BMI 34.50 kg/m2  SpO2 97%  PHYSICAL EXAM: Pt is alert and oriented, WD, WN, very pleasant elderly gentleman in no distress. HEENT: normal Neck: JVP normal. Carotid upstrokes delayed with bilateral bruits. No thyromegaly. Lungs: equal expansion, clear bilaterally CV: Apex is discrete and nondisplaced, irregularly irregular with grade 3/6 harsh crescendo decrescendo  systolic murmur at the right upper sternal border and left lower sternal border  Abd: soft, NT, +BS, no bruit, no hepatosplenomegaly Back: no CVA tenderness Ext: no C/C/E        Femoral pulses 2+=         DP/PT pulses intact and = Skin: warm and dry without rash Neuro: CNII-XII intact             Strength intact = bilaterally  EKG:  Reviewed from 03/10/2012. Atrial fibrillation with diffuse ST and T wave abnormality, LVH with repolarization versus anterolateral ischemia.  2D ECHO: Study Conclusions  - Left ventricle: The cavity size was mildly dilated. There was moderate concentric hypertrophy. Systolic function was mildly to moderately reduced. The estimated ejection fraction was in the range of 40% to 45%. Wall motion was normal; there were no regional wall motion abnormalities. Left ventricular diastolic function parameters were normal. - Aortic valve: Transvalvular velocity was increased. There was moderate stenosis. Mild to Moderate regurgitation. Mean gradient: 29mm Hg (S). Peak gradient: 51mm Hg (S). Peak velocity 357 cm/sec - Mitral valve: Mild regurgitation. - Left atrium: The atrium was mildly dilated. - Tricuspid valve: Mild to Moderate regurgitation. - Pulmonary arteries: PA peak  pressure: 42mm Hg (S).  CARDIAC CATH: see HPI  Pulmonary Function Tests  Pre-Bronch  Baseline  FVC 3.76 L (87% predicted)  FEV1 3.17 L (102% predicted)  FEF25-75 4.02 L (182% predicted)  RV 2.86 L (107% predicted)  DLCO 57% predicted  NO POST-BRONCH RESULTS  6 Minute Walk Test Results  Patient: Christopher Walker  Date: 03/14/2012  Supplemental O2 during test? no  Baseline End  Time 1340 1346  Heartrate 84 86  Dyspnea no yes  Fatigue no yes  O2 sat 97% 98%  Blood pressure 117/67 134/74  Patient ambulated at a moderate pace for a total distance of 840 feet with 4 stops.  Ambulation was limited primarily due to shortness of breath.  Overall the test was tolerated well.  STS Risk  Calculator  Procedure TAVR  Risk of Mortality 3.649%  Morbidity or Mortality 24.871%  Permanent Stroke 2.036%  Prolonged Vent Support 15.240%  DSW Infection 0.597%  Renal Failure 5.705%  Reoperation 7.363%  ASSESSMENT AND PLAN:   04/10/2012 ADDENDUM: This is an addendum to previous note from 03/14/12. Christopher Walker is a 78 year-old patient with aortic stenosis being considered for Transcatheter aortic valve replacement (TAVR). He had a recent echo 03/14/12, but there has been a clinical change as he has been cardioverted from atrial fibrillation to normal sinus rhythm. We are trying to make a decision whether to proceed with TAVR.   I have reviewed his case with the [facility] MD who will approve the study.   Tonny Bollman 04/10/2012 9:23 AM

## 2012-03-14 NOTE — Assessment & Plan Note (Signed)
This is an elderly gentleman with congestive heart failure and moderate to severe aortic stenosis. He underwent an extensive evaluation in the multidisciplinary valve clinic today in conjunction with Dr. Cornelius Moras. We have reviewed all available data including office notes, laboratory data, cardiac catheterization films and report, and echo images and report. His valve has the morphologic appearance of severe aortic stenosis with heavy calcification and restricted opening. His hemodynamics at cardiac catheterization did not show a significant gradient across the aortic valve, but this study was done when his left ventricular ejection fraction was severely depressed. He has had significant recovery of LV function based on his recent echocardiogram and this suggests that some degree of his cardiomyopathy may have been tachycardia mediated. Now that his LV function is improved the transaortic gradient by echo has increased. While his mean gradient is less than 40, his valve area calculations of the severe range. Plans for elective cardioversion are noted and we will reevaluate him again with a return visit an echocardiogram when he is back in sinus rhythm to see if there has been further increase in his valve gradient. At that time we will make a decision about the best course of action for treatment of his aortic stenosis. A dobutamine echo may be necessary to assess for contractile reserve and low-gradient AS, but we are concerned about the risk of recurrent atrial fibrillation with dobutamine provocation.   While the patient's STS risk score does not suggest excessively high operative risk, this patient has had a general decline in his functional status over several years with progressive muscle weakness, tremor, and gait unsteadiness. I would anticipate his recovery from cardiac surgery would be prolonged and difficult. Will review further with Dr. Cornelius Moras at the time of the patient's followup visit.

## 2012-03-14 NOTE — Progress Notes (Addendum)
[address]            [place] [address]          [phone]     CARDIOTHORACIC SURGERY CONSULTATION REPORT  Referring Provider is Sherryl Manges, MD PCP is Yates Decamp, MD  Chief Complaint  Patient presents with  . Aortic Stenosis    eval for tavr    HPI:  Patient is a 78 year old white male from [place] with aortic stenosis, persistent atrial fibrillation, congestive heart failure, hypertension, type 2 diabetes mellitus, and degenerative arthritis. The patient describes a several year gradual decline in his overall functional status that seems to be multifactorial. He has developed progressive exertional shortness of breath as well as generalized weakness. He initially presented in early May of this year to see Dr. Darrold Junker in [place] where he was noted to be in congestive heart failure. He was admitted to the hospital at [facility] during which time he had an echocardiogram and left and right heart catheterization. He was noted to have dilated nonischemic cardiomyopathy with ejection fraction estimated 24% by catheterization. At the time his aortic stenosis was felt not to be significant. Although full details of that hospitalization are not currently available, he apparently improved symptomatically with diuresis of more than 25 pounds off his baseline weight.  He was later referred to Dr. Graciela Husbands for possible placement of a defibrillator because of his underlying cardiomyopathy. A follow up echocardiogram was performed that revealed some improvement of his ejection fraction to 40-45% and what appeared to be severe aortic stenosis with peak and mean transvalvular gradients of 51 and 29 mmHg, respectively with calculated valve area estimated to be 0.8 cm2.  He has now been referred to the multidisciplinary heart valve clinic to discuss possible treatment options for management of severe symptomatic aortic stenosis.  The patient  describes a gradual progression of symptoms of exertional shortness of breath. He denies resting shortness of breath, PND, and orthopnea. He has had some problems with lower extremity edema, but this has been improved on medical therapy. He has never had any chest pain or chest tightness either with activity or at rest. He has not had any dizzy spells nor syncope. From a functional standpoint the patient is also limited to generalized weakness. The patient has chronic pain in his lower back left hip and leg which limits him some, and he also reports having some difficulty getting up from a chair to stand due to weakness in his legs.  However, he states that with ambulation his primary limitation is shortness of breath. He also has complaints of worsening intention tremor and generalized weakness. He states that beyond this just doesn't feel like doing much of anything any longer and he doesn't know why.  Past Medical History  Diagnosis Date  . Chronic systolic heart failure   . Hypertension   . Diabetes mellitus   . Coronary artery disease     Two vessel CAD; 60% stenosis proximal LAD, 90% stenosis distal LAD, occluded posterior descending artery, 50% stenosis mid left circumflex with LVF of 25%.  . Atrial fibrillation   . Aortic stenosis   . History of stomach ulcers   . Arthritis   . Nonischemic cardiomyopathy     With concomitant coronary disease  . CHF (congestive heart failure)     Past Surgical History  Procedure Date  . Cardiac  catheterization 11/26/2011    No significant aortic stenosis by Cardiac Cath     Family History  Problem Relation Age of Onset  . Heart disease Brother     1/4  . Cancer Father   . Emphysema Sister     1/2  . Cancer Sister     2/2  . Hernia Brother     2/4  . Heart disease Brother     3/4    Social History History  Substance Use Topics  . Smoking status: Former Smoker -- 1.5 packs/day for 10 years    Quit date: 06/23/1964  . Smokeless tobacco:  Not on file  . Alcohol Use: No    Current Outpatient Prescriptions  Medication Sig Dispense Refill  . apixaban (ELIQUIS) 5 MG TABS tablet Take 1 tablet (5 mg total) by mouth 2 (two) times daily.  90 tablet  3  . digoxin (LANOXIN) 0.125 MG tablet Take 1 tablet (0.125 mg total) by mouth daily.  90 tablet  3  . furosemide (LASIX) 40 MG tablet Take by mouth. Take 20 mg every other day, alternating 40 mg every other day      . glimepiride (AMARYL) 2 MG tablet Take 2 mg by mouth daily before breakfast.       . metFORMIN (GLUCOPHAGE) 850 MG tablet Take 850 mg by mouth 2 (two) times daily with a meal.      . metoprolol succinate (TOPROL-XL) 100 MG 24 hr tablet Take 100 mg by mouth daily.      . ramipril (ALTACE) 10 MG capsule Take 10 mg by mouth daily.      . simvastatin (ZOCOR) 40 MG tablet Take 40 mg by mouth every evening.      . traMADol-acetaminophen (ULTRACET) 37.5-325 MG per tablet Take 1 tablet by mouth every 6 (six) hours as needed.      . zinc gluconate 50 MG tablet Take 50 mg by mouth daily.        Allergies  Allergen Reactions  . Avandia (Rosiglitazone)      Review of Systems:   General:  normal appetite, decreased energy, + weight gain, no weight loss, no fever  Cardiac:  no chest pain with exertion, no chest pain at rest, +SOB with minimal exertion, no resting SOB, no PND, no orthopnea, no palpitations, + arrhythmia, + atrial fibrillation, + LE edema, no dizzy spells, no syncope  Respiratory:  + shortness of breath, no home oxygen, + productive cough, no dry cough, no bronchitis, no wheezing, no hemoptysis, no asthma, no pain with inspiration or cough, no sleep apnea, no CPAP at night  GI:   occasional difficulty swallowing, + reflux, no frequent heartburn, + hiatal hernia, no abdominal pain, no constipation, no diarrhea, no hematochezia, no hematemesis, no melena  GU:   no dysuria,  + frequency, no urinary tract infection, no hematuria, + enlarged prostate with elevated PSA  level, no kidney stones, no kidney disease  Vascular:  no pain suggestive of claudication, no pain in feet, + leg cramps left leg, no varicose veins, no DVT, no non-healing foot ulcer  Neuro:   no stroke, no TIA's, no seizures, no headaches, notemporary blindness one eye,  no slurred speech, + peripheral neuropathy, + chronic pain, + instability of gait, + intention tremor, no memory/cognitive dysfunction  Musculoskeletal: + arthritis primarily lower back and left hip/leg pain, no joint swelling, no myalgias, + difficulty walking, mildly limited mobility   Skin:   no rash, + itching, no  skin infections, no pressure sores or ulcerations  Psych:   + anxiety, + depression, + nervousness, no unusual recent stress  Eyes:   + blurry vision, no floaters, + recent vision changes, + wears glasses or contacts  ENT:   NO hearing loss, NO loose or painful teeth, + dentures, last saw dentist mAY 2013  Hematologic:  + easy bruising, NO abnormal bleeding, NOclotting disorder, NO frequent epistaxis  Endocrine:  + diabetes, does not check CBG's at home every morning             Physical Exam:   BP 117/67  Pulse 84  Resp 18  Ht 6' (1.829 m)  Wt 254 lb 6.4 oz (115.395 kg)  BMI 34.50 kg/m2  SpO2 97%  General:  Obese,  Somewhat frail-appearing  HEENT:  Unremarkable   Neck:   no JVD, no bruits, no adenopathy   Chest:   clear to auscultation, symmetrical breath sounds, no wheezes, no rhonchi   CV:   Irregular rate and rhythm, soft grade III/VI systolic murmur   Abdomen:  soft, non-tender, no masses   Extremities:  warm, well-perfused, pulses not palpable, + trace LE edema  Rectal/GU  Deferred  Neuro:   Grossly non-focal and symmetrical throughout  Skin:   Clean and dry, no rashes, no breakdown   Diagnostic Tests:  Transthoracic Echocardiography  Echocardiogram performed 02/13/2012 at the [facility] office is reviewed directly with Dr. Excell Seltzer. Findings are as noted below. There is at least  moderate aortic stenosis if not severe aortic stenosis.  Morphologically findings appear consistent with severe aortic stenosis and the valve area is calculated 0.8cm2, although the peak velocity measured < 4 m/sec.  Hemodynamic measurements are at least partially affected by the fact that the patient remains in persistent atrial fibrillation. Left ventricular function is reduced but noticeably better than it appeared at catheterization 2 months previously.  Patient: Adden, Strout MR #: [id number] Study Date: 02/13/2012 Gender: M Age: 27 Height: 182.9cm Weight: 116.1kg BSA: 2.55m^2 Pt. Status: Room:  Mayer Camel MD REFERRING Tonny Bollman SONOGRAPHER Missy Al-Rammal, RVT, RDCS PERFORMING Corinda Gubler, [place] REFERRING Excell Seltzer ATTENDING Baldo Daub Rowe Clack cc:  ------------------------------------------------------------ LV EF: 40% - 45%  ------------------------------------------------------------ History: PMH: Murmur. Atrial fibrillation. Cardiomyopathy of unknown etiology. Aortic valve disease. Risk factors: s/p cardiac cath at [facility] in 11/2011: severely dilated cardiomyopathy, EF 24%, AVA 1.2.  2D Echo at [facility] 02/11/12: moderate LV dysfunction, moderate LVH, EF 30-35%, severe AS, mean gradient 25 mmHg, AVA 0.78-1.06 cm2 Hypertension. Diabetes mellitus.  ------------------------------------------------------------ Study Conclusions  - Left ventricle: The cavity size was mildly dilated. There was moderate concentric hypertrophy. Systolic function was mildly to moderately reduced. The estimated ejection fraction was in the range of 40% to 45%. Wall motion was normal; there were no regional wall motion abnormalities. Left ventricular diastolic function parameters were normal. - Aortic valve: Transvalvular velocity was increased. There was moderate stenosis. Mild to Moderate regurgitation. Mean gradient: 29mm  Hg (S). Peak gradient: 51mm Hg (S). Peak velocity 357 cm/sec - Mitral valve: Mild regurgitation. - Left atrium: The atrium was mildly dilated. - Tricuspid valve: Mild to Moderate regurgitation. - Pulmonary arteries: PA peak pressure: 42mm Hg (S). Transthoracic echocardiography. M-mode, complete 2D, spectral Doppler, and color Doppler. Height: Height: 182.9cm. Height: 72in. Weight: Weight: 116.1kg. Weight: 255.5lb. Body mass index: BMI: 34.7kg/m^2. Body surface area: BSA: 2.31m^2. Blood pressure: 120/80. Patient status: Outpatient.  ------------------------------------------------------------  ------------------------------------------------------------ Left ventricle: The cavity size was mildly  dilated. There was moderate concentric hypertrophy. Systolic function was mildly to moderately reduced. The estimated ejection fraction was in the range of 40% to 45%. Wall motion was normal; there were no regional wall motion abnormalities. The transmitral flow pattern was normal. The deceleration time of the early transmitral flow velocity was normal. The pulmonary vein flow pattern was normal. The tissue Doppler parameters were normal. Left ventricular diastolic function parameters were normal.  ------------------------------------------------------------ Aortic valve: Mildlythickened, severely calcified leaflets. Mobility was not restricted. Doppler: Transvalvular velocity was increased. There was moderate stenosis. Mild to Moderate regurgitation. VTI ratio of LVOT to aortic valve: 0.21. Valve area: 0.87cm^2(VTI). Indexed valve area: 0.37cm^2/m^2 (VTI). Peak velocity ratio of LVOT to aortic valve: 0.21. Valve area: 0.86cm^2 (Vmax). Mean gradient: 29mm Hg (S). Peak gradient: 51mm Hg (S). Peak velocity 357 cm/sec  ------------------------------------------------------------ Aorta: The aorta was normal, not dilated, and non-diseased. Aortic root: The aortic root was mildly  dilated. Ascending aorta: The ascending aorta was normal in size.  ------------------------------------------------------------ Mitral valve: Structurally normal valve. Mobility was not restricted. Doppler: Transvalvular velocity was within the normal range. There was no evidence for stenosis. Mild regurgitation. Valve area by pressure half-time: 2.78cm^2. Indexed valve area by pressure half-time: 1.17cm^2/m^2. Peak gradient: 2mm Hg (D).  ------------------------------------------------------------ Left atrium: The atrium was mildly dilated.  ------------------------------------------------------------ Right ventricle: The cavity size was normal. Wall thickness was normal. Systolic function was normal.  ------------------------------------------------------------ Pulmonic valve: Structurally normal valve. Cusp separation was normal. Doppler: Transvalvular velocity was within the normal range. There was no evidence for stenosis. Trivial regurgitation.  ------------------------------------------------------------ Tricuspid valve: Structurally normal valve. Doppler: Transvalvular velocity was within the normal range. Mild to Moderate regurgitation.  ------------------------------------------------------------ Pulmonary artery: The main pulmonary artery was normal-sized. RVSP elevated consistent with mild pulmonary HTN.  ------------------------------------------------------------ Right atrium: The atrium was normal in size.  ------------------------------------------------------------ Pericardium: There was no pericardial effusion.  ------------------------------------------------------------ Systemic veins: Inferior vena cava: The vessel was normal in size.  ------------------------------------------------------------ Post procedure conclusions Ascending Aorta:  - The aorta was normal, not dilated, and  non-diseased.  ------------------------------------------------------------  2D measurements Normal Doppler measurements Normal Left ventricle Main pulmonary LVID ED, 50.2 mm 43-52 artery chord, Pressure, 42 mm Hg =30 PLAX S LVID ES, 40 mm 23-38 LVOT chord, Peak vel, 74 cm/s ------ PLAX S FS, chord, 20 % >29 VTI, S 15.9 cm ------ PLAX Aortic valve LVPW, ED 15 mm ------ Peak vel, 357 cm/s ------ IVS/LVPW 1.2 <1.3 S ratio, ED Mean vel, 254 cm/s ------ Vol ED, 116 ml ------ S MOD1 VTI, S 75.6 cm ------ Vol ES, 53 ml ------ Mean 29 mm Hg ------ MOD1 gradient, Vol index, 49 ml/m^2 ------ S ED, MOD1 Peak 51 mm Hg ------ Vol index, 22 ml/m^2 ------ gradient, ES, MOD1 S Vol ED, 97 ml ------ VTI ratio 0.21 ------ MOD2 LVOT/AV Vol ES, 52 ml ------ Area, VTI 0.87 cm^2 ------ MOD2 Area index 0.37 cm^2/m ------ EF, MOD2 46 % ------ (VTI) ^2 Stroke 45 ml ------ Peak vel 0.21 ------ vol, MOD2 ratio, Vol index, 41 ml/m^2 ------ LVOT/AV ED, MOD2 Area, Vmax 0.86 cm^2 ------ Vol index, 22 ml/m^2 ------ Regurg PHT 586 ms ------ ES, MOD2 Mitral valve Stroke 19 ml/m^2 ------ Peak E vel 76.8 cm/s ------ index, Peak A vel 35.1 cm/s ------ MOD2 Decelerati 243 ms 150-23 Ventricular septum on time 0 IVS, ED 18 mm ------ Pressure 79 ms ------ LVOT half-time Diam, S 23 mm ------ Peak 2 mm Hg ------ Area 4.15 cm^2 ------ gradient, Aorta  D Root diam, 38 mm ------ Peak E/A 2.2 ------ ED ratio Left atrium Area (PHT) 2.78 cm^2 ------ AP dim 43 mm ------ Area index 1.17 cm^2/m ------ AP dim 1.81 cm/m^2 <2.2 (PHT) ^2 index Max regurg 306 cm/s ------ Vol, S 50 ml ------ vel Vol index, 21.1 ml/m^2 ------ Regurg VTI 85 cm ------ S Tricuspid valve Regurg 284 cm/s ------ peak vel Peak RV-RA 32 mm Hg ------ gradient, S Max regurg 284 cm/s ------ vel Pulmonic valve Peak vel, 128 cm/s ------ S Regurg 72.6 cm/s ------ vel,  ED  ------------------------------------------------------------ Prepared and Electronically Authenticated by  Dossie Arbour [id number]:23:[id number]   CARDIAC CATHETERIZATION  Report of left and right heart catheterization performed 11/26/2011 [facility] is reviewed. By report there was severe dilated cardiomyopathy with ejection fraction estimated 24%. There was multivessel coronary artery disease with 60% tubular proximal stenosis of the left anterior descending coronary artery and 90% stenosis of the distal left anterior descending coronary artery. There is 100% chronic occlusion of the posterior descending coronary artery. There was moderate disease in the left circumflex system. Mean gradient across the aortic valve was measured 18 mm mercury and the valve area was estimated to be 1.2 cm. This was interpreted as no significant aortic stenosis. Pulmonary artery pressures measured 30/17 with palmar And wedge pressure of 19 central venous pressure of 10. Cardiac output was estimated between 4.2 and 4.3 L per minute corresponding to a cardiac index of 1.8.    Pulmonary Function Tests Baseline                                                                        FVC                 3.76 L  (87% predicted)           FEV1               3.17 L  (102% predicted)         [id number]        4.02 L  (182% predicted)          RV                   2.86 L  (107% predicted) DLCO              57% predicted    6 Minute Walk Test Results  Patient:            Plumer Mittelstaedt Date:               03/14/2012   Supplemental O2 during test?            no                                       Baseline                                 End  Time  1340                                        1346 Heartrate                     84                                            86 Dyspnea                      no                                             yes Fatigue                        no                                            yes O2 sat                         97%                                         98% Blood pressure           117/67                                     134/74   Patient ambulated at a moderate pace for a total distance of 840 feet with 4 stops.  Ambulation was limited primarily due to shortness of breath.  Overall the test was tolerated well.    STS Risk Calculator  Procedure                                         TAVR  Risk of Mortality                                2.724% Morbidity or Mortality                       19.088% Prolonged LOS                                   7.757% Short LOS                                           26.747% Permanent Stroke  1.306% Prolonged Vent Support                      12.320% DSW Infection                                     0.597% Renal Failure                                       5.705% Reoperation                                        7.363%                    Impression:  The patient has at least moderate in probably severe symptomatic aortic stenosis with chronic systolic congestive heart failure further exacerbated by the presence of persistent atrial fibrillation. The patient also has generalized weakness and progressive fatigue that may be unrelated to his cardiac issues and suggestive of the possibility of an underlying neurologic disorder and/or depression.  Risks associated with conventional aortic valve replacement with or without coronary artery bypass grafting and/or Maze procedure would certainly be elevated. I'm concerned by the patient's continued gradual decline over many years with the possibility that a considerable portion of this symptoms may be unrelated to his cardiac disease. Nevertheless, the patient does appear to have severe aortic stenosis and there is no question that he is symptomatic with congestive  heart failure.   Plan:  I've discussed matters at length with the patient and his wife and reviewed his case extensively with Dr. Excell Seltzer in the multidisciplinary heart valve clinic today. The patient is scheduled for outpatient cardioversion next week. It will be interesting to see if he can maintain sinus rhythm, and as a next step we favor obtaining a repeat transthoracic echocardiogram following cardioversion to reassess both left ventricular function and the severity of aortic stenosis. Dobutamine echocardiogram could be considered but might put the patient back into atrial fibrillation. Repeat catheterization could be considered as well but is probably not necessary at this time. We'll have the patient return in one month with followup echocardiogram at that time. We've also suggested that the patient followup with his neurologist. All of his questions been addressed.    Salvatore Decent. Cornelius Moras, MD 03/14/2012 3:43 PM

## 2012-03-15 NOTE — Addendum Note (Signed)
Encounter addended by: Purcell Nails, MD on: 03/15/2012  6:45 AM<BR>     Documentation filed: Notes Section

## 2012-03-17 ENCOUNTER — Other Ambulatory Visit: Payer: Self-pay | Admitting: *Deleted

## 2012-03-17 DIAGNOSIS — I359 Nonrheumatic aortic valve disorder, unspecified: Secondary | ICD-10-CM

## 2012-03-20 ENCOUNTER — Ambulatory Visit: Payer: Medicare Other | Admitting: Internal Medicine

## 2012-03-20 ENCOUNTER — Ambulatory Visit: Payer: Self-pay | Admitting: Cardiology

## 2012-03-26 ENCOUNTER — Telehealth: Payer: Self-pay | Admitting: Cardiovascular Disease

## 2012-03-26 NOTE — Telephone Encounter (Signed)
New problem:  Samples of eliquis 5 mg. Has another appt today can come the office to pick up

## 2012-03-26 NOTE — Telephone Encounter (Signed)
This is Dr Odessa Fleming patient.  I placed a 4 week supply of Eliquis at the front desk for pick-up.  Pt's wife aware.

## 2012-04-07 ENCOUNTER — Telehealth: Payer: Self-pay | Admitting: *Deleted

## 2012-04-07 NOTE — Telephone Encounter (Signed)
Called Christopher Walker to remind him about his TAVR appt with Dr. Cornelius Moras and Dr. Excell Seltzer for Friday September 20th at 3:00pm.  Answered all of his questions.

## 2012-04-10 NOTE — Addendum Note (Signed)
Encounter addended by: Tonny Bollman, MD on: 04/10/2012  9:28 AM<BR>     Documentation filed: Notes Section

## 2012-04-11 ENCOUNTER — Encounter (HOSPITAL_COMMUNITY): Payer: Self-pay | Admitting: Thoracic Surgery (Cardiothoracic Vascular Surgery)

## 2012-04-11 ENCOUNTER — Encounter (HOSPITAL_COMMUNITY): Payer: Self-pay | Admitting: Cardiovascular Disease

## 2012-04-11 ENCOUNTER — Other Ambulatory Visit: Payer: Self-pay | Admitting: *Deleted

## 2012-04-11 ENCOUNTER — Ambulatory Visit (HOSPITAL_BASED_OUTPATIENT_CLINIC_OR_DEPARTMENT_OTHER)
Admission: RE | Admit: 2012-04-11 | Discharge: 2012-04-11 | Disposition: A | Discharge status: Home / Self Care | Payer: Medicare Other | Source: Ambulatory Visit | Attending: Thoracic Surgery (Cardiothoracic Vascular Surgery) | Admitting: Thoracic Surgery (Cardiothoracic Vascular Surgery)

## 2012-04-11 ENCOUNTER — Ambulatory Visit (HOSPITAL_BASED_OUTPATIENT_CLINIC_OR_DEPARTMENT_OTHER): Payer: Medicare Other | Admitting: Radiology

## 2012-04-11 ENCOUNTER — Ambulatory Visit (HOSPITAL_COMMUNITY)
Admission: RE | Admit: 2012-04-11 | Discharge: 2012-04-11 | Disposition: A | Discharge status: Home / Self Care | Payer: Medicare Other | Source: Ambulatory Visit | Attending: Cardiovascular Disease | Admitting: Cardiovascular Disease

## 2012-04-11 VITALS — BP 135/60 | HR 49 | Resp 18 | Ht 72.0 in | Wt 261.1 lb

## 2012-04-11 VITALS — BP 135/60 | HR 49 | Resp 19 | Ht 72.0 in | Wt 261.1 lb

## 2012-04-11 DIAGNOSIS — I2510 Atherosclerotic heart disease of native coronary artery without angina pectoris: Secondary | ICD-10-CM

## 2012-04-11 DIAGNOSIS — I359 Nonrheumatic aortic valve disorder, unspecified: Secondary | ICD-10-CM

## 2012-04-11 DIAGNOSIS — I5022 Chronic systolic (congestive) heart failure: Secondary | ICD-10-CM | POA: Insufficient documentation

## 2012-04-11 DIAGNOSIS — I4891 Unspecified atrial fibrillation: Secondary | ICD-10-CM

## 2012-04-11 DIAGNOSIS — I350 Nonrheumatic aortic (valve) stenosis: Secondary | ICD-10-CM

## 2012-04-11 NOTE — Progress Notes (Signed)
Echocardiogram performed.  

## 2012-04-11 NOTE — Progress Notes (Signed)
MULTIDISCIPLINARY HEART VALVE CLINIC NOTE  HPI: Mr. Christopher Walker returns for followup evaluation. He is a 78 year old gentleman with mixed ischemic and nonischemic cardiomyopathy, atrial fibrillation, and aortic stenosis. There has been some contradictory data about the severity of his aortic stenosis, but after review we have felt that he has at least moderately severe aortic stenosis. He initially presented with a severe cardiomyopathy that in retrospect appears to be related to atrial fibrillation with RVR (tachycardia mediated cardiomyopathy). His atrial fib has been rate controlled but he more recently was cardioverted. He had a followup echocardiogram this morning now that he is back in sinus rhythm so that we can better assess the hemodynamic significance of his aortic stenosis.  The patient does not appreciate any significant clinical change since his cardioversion last month. He denies chest pain or syncope. However, he does admit to significant limitation from exertional dyspnea. He feels fatigued much of the time. He denies leg swelling, orthopnea, or PND. He's tolerating anticoagulation without bleeding problems.  Allergies  Allergen Reactions  . Avandia (Rosiglitazone)     Past Medical History  Diagnosis Date  . Chronic systolic heart failure   . Hypertension   . Diabetes mellitus   . Coronary artery disease     Two vessel CAD; 60% stenosis proximal LAD, 90% stenosis distal LAD, occluded posterior descending artery, 50% stenosis mid left circumflex with LVF of 25%.  . Atrial fibrillation   . Aortic stenosis   . History of stomach ulcers   . Arthritis   . Nonischemic cardiomyopathy     With concomitant coronary disease  . CHF (congestive heart failure)    BP 135/60  Pulse 49  Resp 19  Ht 6' (1.829 m)  Wt 261 lb 1.9 oz (118.443 kg)  BMI 35.41 kg/m2  SpO2 97%  PHYSICAL EXAM: Pt is alert and oriented, elderly but relatively well-appearing gentleman in NAD HEENT: normal Neck:  JVP - normal, carotids 2+= with bilateral bruits Lungs: CTA bilaterally CV: RRR with grade 3/6 systolic crescendo decrescendo murmur best heard at the left lower sternal border Abd: soft, NT, Positive BS, no hepatomegaly Ext: no C/C/E, distal pulses intact and equal Skin: warm/dry no rash  2D ECHO: Study Conclusions  - Left ventricle: Wall thickness was increased in a pattern of moderate LVH. Systolic function was normal. The estimated ejection fraction was in the range of 55% to 60%. Wall motion was normal; there were no regional wall motion abnormalities. Features are consistent with a pseudonormal left ventricular filling pattern, with concomitant abnormal relaxation and increased filling pressure (grade 2 diastolic dysfunction). E/medial e' > 15 suggests LV end diastolic pressure at least 20 mmHg. - Aortic valve: Trileaflet; severely calcified leaflets. Valve mobility was severely restricted. There was severe stenosis. Trivial regurgitation. Mean gradient: 48mm Hg (S). Peak gradient: 84mm Hg (S). Peak Aortic valve gradient when patient was in bigeminy was 66 mm Hg and mean was 41 mm Hg. Valve area:0.98cm^2(VTI). - Aorta: Ascending aortic diameter: 38mm (S). Mild dilation ascending aorta. - Mitral valve: Trivial regurgitation. - Left atrium: The atrium was moderately dilated. - Right ventricle: The cavity size was normal. Systolic function was normal. - Tricuspid valve: Peak RV-RA gradient: 37mm Hg (S). - Pulmonary arteries: PA peak pressure: 42mm Hg (S). - Inferior vena cava: The vessel was normal in size; the respirophasic diameter changes were in the normal range (= 50%); findings are consistent with normal central venous pressure. Impressions:  - Normal LV size with moderate LV hypertrophy. EF 55-60%.  Moderate diastolic dysfunction with evidence for elevated LV filling pressure. Severe aortic stenosis with mean gradient 48 mmHg and AVA 0.98 cm^2 (large LV  outflow tract). Normal RV size and systolic function. Mild pulmonary hypertension.  ASSESSMENT AND PLAN: Severe symptomatic aortic stenosis: The patient's echocardiographic images were reviewed in detail as the patient was seen in conjunction today with Dr. Cornelius Moras in the multidisciplinary valve clinic. We also discussed the results of the echo with Dr. Shirlee Latch who formally interpreted the study. The patient clearly meets criteria for severe aortic stenosis with a mean gradient over 40 mmHg and aortic valve area less than 1 cm. We reviewed potential treatment options including open aortic valve replacement versus transcatheter aortic valve replacement. The patient's STS score does not place him in a high surgical risk category. He does have some increased risk of prolonged recovery from surgery because of his age and physical limitations. However, his risk is clearly not prohibitive. I agree that his best treatment option is open surgical aortic valve replacement. He may require coronary bypass grafting, but we are awaiting his cardiac cath films for full interpretation. We tried to review his films online, but it is difficult to make clinical decisions based on this. We have called and requested that films be sent for our review next week. The patient will be seen in followup by Dr. Cornelius Moras just before surgery.   Greater than 30 minutes was spent in care of this patient and review of data, greater than 50% of which was spent in direct patient care.

## 2012-04-11 NOTE — Progress Notes (Addendum)
[facility]   MULTIDISCIPLINARY HEART VALVE CLINIC       CARDIOTHORACIC SURGERY NOTE  Referring Provider is Duke Salvia, MD Primary Cardiologist is Paraschos, Trinna Post, MD PCP is Yates Decamp, MD   HPI:  Patient returns for followup of severe symptomatic aortic stenosis with history of atrial fibrillation, congestive heart failure, coronary artery disease, hypertension, type 2 diabetes mellitus, and arthritis. He was originally seen in the multidisciplinary heart valve clinic on 03/14/2012. Since then he underwent DC cardioversion by Dr. Darrold Junker. Patient returns to the clinic today and underwent followup echocardiogram. He reports that since cardioversion he doesn't really feel any better than he did prior to cardioversion. His primary complaint remains that he gives out and get short of breath with physical activity. He has not had resting shortness of breath. He has not had any chest discomfort but he does note that occasionally he gets dull aching pain radiating up into his jaw. He states that this occurs primarily if he is under stress or aggravated, and episodes typically only last for a few minutes and all much longer. He has not had any dizzy spells or syncope. He denies any PND, orthopnea, or lower extremity edema.  The remainder of his review of systems is unchanged from previously.   Current Outpatient Prescriptions  Medication Sig Dispense Refill  . apixaban (ELIQUIS) 5 MG TABS tablet Take 1 tablet (5 mg total) by mouth 2 (two) times daily.  90 tablet  3  . digoxin (LANOXIN) 0.125 MG tablet Take 1 tablet (0.125 mg total) by mouth daily.  90 tablet  3  . furosemide (LASIX) 40 MG tablet Take by mouth. Take 20 mg every other day, alternating 40 mg every other day      . glimepiride (AMARYL) 2 MG tablet Take 2 mg by mouth daily before breakfast.       . metFORMIN (GLUCOPHAGE) 850 MG tablet Take 850 mg by mouth 2 (two) times daily with a meal.      . metoprolol  succinate (TOPROL-XL) 100 MG 24 hr tablet Take 100 mg by mouth daily.      . ramipril (ALTACE) 10 MG capsule Take 10 mg by mouth daily.      . simvastatin (ZOCOR) 40 MG tablet Take 40 mg by mouth every evening.      . traMADol-acetaminophen (ULTRACET) 37.5-325 MG per tablet Take 1 tablet by mouth every 6 (six) hours as needed.      . zinc gluconate 50 MG tablet Take 50 mg by mouth daily.          Physical Exam:   BP 135/60  Pulse 49  Resp 18  Ht 6' (1.829 m)  Wt 261 lb 1.9 oz (118.443 kg)  BMI 35.41 kg/m2  SpO2 97%  General:  Elderly but well-appearing  Chest:   Clear to auscultation with symmetrical breath sounds  CV:   Regular rate and rhythm with prominent systolic murmur  Incisions:  n/a  Abdomen:  Soft and nontender  Extremities:  Warm, well-perfused, with mild lower extremity edema  Diagnostic Tests:  Transthoracic Echocardiography  (Report amended )  Patient:    Christopher Walker, Christopher Walker MR #:       [id number] Study Date: 04/11/2012 Gender:     M Age:        78 Height:     182.9cm Weight:     115.2kg BSA:        2.24m^2 Pt. Status: Room:    ORDERING  Shaune Pascal, Posey Pronto, [place]  PERFORMING   [facility], [facility]  SONOGRAPHER  Junious Dresser, RDCS cc:  ------------------------------------------------------------ LV EF: 55% -   60%  ------------------------------------------------------------ Indications:      425.4 Other primary Cardiomyopathies. 427.31 Atrial Fibrillation.  Aortic stenosis /insufficiency 424.1.  ------------------------------------------------------------ History:   PMH:  Acquired from the patient and from the patient's chart. Systolic murmur. 3/6 Harsh crescendo-decrescendo murmur at the right upper sternal border and left lower sternal border.  Atrial fibrillation. Coronary artery disease.  Non-ischemic cardiomyopathy, with congestive heart failure, with an ejection fraction of  45%by echocardiography. The dysfunction is primarily systolic. Moderate aortic stenosis.  Risk factors:  Former tobacco use. Hypertension. Diabetes mellitus. Obese.  ------------------------------------------------------------ Study Conclusions  - Left ventricle: Wall thickness was increased in a pattern   of moderate LVH. Systolic function was normal. The   estimated ejection fraction was in the range of 55% to   60%. Wall motion was normal; there were no regional wall   motion abnormalities. Features are consistent with a   pseudonormal left ventricular filling pattern, with   concomitant abnormal relaxation and increased filling   pressure (grade 2 diastolic dysfunction). E/medial e' > 15   suggests LV end diastolic pressure at least 20 mmHg. - Aortic valve: Trileaflet; severely calcified leaflets.   Valve mobility was severely restricted. There was severe   stenosis. Trivial regurgitation. Mean gradient: 48mm Hg   (S). Peak gradient: 84mm Hg (S). Peak Aortic valve   gradient when patient was in bigeminy was 66 mm Hg and   mean was 41 mm Hg. Valve area:0.98cm^2(VTI). - Aorta: Ascending aortic diameter: 38mm (S). Mild dilation   ascending aorta. - Mitral valve: Trivial regurgitation. - Left atrium: The atrium was moderately dilated. - Right ventricle: The cavity size was normal. Systolic   function was normal. - Tricuspid valve: Peak RV-RA gradient: 37mm Hg (S). - Pulmonary arteries: PA peak pressure: 42mm Hg (S). - Inferior vena cava: The vessel was normal in size; the   respirophasic diameter changes were in the normal range (=   50%); findings are consistent with normal central venous   pressure. Impressions:  - Normal LV size with moderate LV hypertrophy. EF 55-60%.   Moderate diastolic dysfunction with evidence for elevated   LV filling pressure. Severe aortic stenosis with mean   gradient 48 mmHg and AVA 0.98 cm^2 (large LV outflow   tract). Normal RV size and  systolic function. Mild   pulmonary hypertension.  ------------------------------------------------------------ Labs, prior tests, procedures, and surgery: Echocardiography (July 2013).    The aortic valve showed moderate stenosis and mild to moderate regurgitation. The tricuspid valve showed mild to moderate regurgitation.  EF was 45% and PA pressure was 42 (systolic). Aortic valve: peak gradient of 29mm Hg and mean gradient of 51mm Hg.  Echocardiography (July 2013).    The aortic valve showed stenosis.  EF was 35%. Aortic valve: mean gradient of 25mm Hg. Transthoracic echocardiography.  M-mode, complete 2D, spectral Doppler, and color Doppler.  Height:  Height: 182.9cm. Height: 72in.  Weight:  Weight: 115.2kg. Weight: 253.5lb.  Body mass index:  BMI: 34.4kg/m^2.  Body surface area:    BSA: 2.42m^2.  Blood pressure:     120/75.  Patient status:  Outpatient.  Location:  [facility]  ------------------------------------------------------------  ------------------------------------------------------------ Left ventricle:   Wall thickness was increased in a pattern of moderate LVH.   Systolic  function was normal. The estimated ejection fraction was in the range of 55% to 60%. Wall motion was normal; there were no regional wall motion abnormalities. Features are consistent with a pseudonormal left ventricular filling pattern, with concomitant abnormal relaxation and increased filling pressure (grade 2 diastolic dysfunction). E/medial e' > 15 suggests LV end diastolic pressure at least 20 mmHg.  ------------------------------------------------------------ Aortic valve:   Trileaflet; severely calcified leaflets. Valve mobility was severely restricted.  Doppler:   There was severe stenosis.    Trivial regurgitation.    VTI ratio of LVOT to aortic valve: 0.18. Valve area:0.98cm^2(VTI). Indexed valve area: 0.45cm^2/m^2 (VTI). Peak velocity ratio of LVOT to aortic valve: 0.19.  Indexed valve area: 0.47cm^2/m^2 (Vmax).    Mean gradient: 48mm Hg (S). Peak gradient: 84mm Hg (S).  ------------------------------------------------------------ Aorta:  Mild dilation ascending aorta.  ------------------------------------------------------------ Mitral valve:   Doppler:   There was no evidence for stenosis.    Trivial regurgitation.    Peak gradient: 3mm Hg (D).  ------------------------------------------------------------ Left atrium:  The atrium was moderately dilated.  ------------------------------------------------------------ Right ventricle:  The cavity size was normal. Systolic function was normal.  ------------------------------------------------------------ Pulmonic valve:    Structurally normal valve.   Cusp separation was normal.  Doppler:  Transvalvular velocity was within the normal range.  Trivial regurgitation.  ------------------------------------------------------------ Tricuspid valve:   Doppler:   Mild regurgitation.  ------------------------------------------------------------ Right atrium:  The atrium was normal in size.  ------------------------------------------------------------ Pericardium:  There was no pericardial effusion.  ------------------------------------------------------------ Systemic veins: Inferior vena cava: The vessel was normal in size; the respirophasic diameter changes were in the normal range (= 50%); findings are consistent with normal central venous pressure.  ------------------------------------------------------------ Post procedure conclusions Ascending Aorta:  - Mild dilation ascending aorta.  ------------------------------------------------------------  2D measurements        Normal  Doppler measurements   Normal Left ventricle                 Main pulmonary LVID ED,   46.4 mm     43-52   artery chord,                         Pressure,    42 mm Hg  =30 PLAX                           S LVID ES,    26.6 mm     23-38   Left ventricle chord,                         Ea, lat    9.98 cm/s   ------ PLAX                           ann, tiss FS, chord,   43 %      >29     DP PLAX                           E/Ea, lat  9.05        ------ LVPW, ED   14.1 mm     ------  ann, tiss IVS/LVPW   1.45        <1.3    DP ratio, ED  Ea, med    4.39 cm/s   ------ Ventricular septum             ann, tiss IVS, ED    20.4 mm     ------  DP LVOT                           E/Ea, med  20.5        ------ Diam, S      27 mm     ------  ann, tiss     7 Area       5.73 cm^2   ------  DP Diam         25 mm     ------  LVOT Aorta                          Peak vel,    88 cm/s   ------ Root diam,   39 mm     ------  S ED                             VTI, S     22.2 cm     ------ AAo AP       38 mm     ------  Stroke vol  109 ml     ------ diam, S                        Stroke     46.2 ml/m^2 ------ Left atrium                    index AP dim       50 mm     ------  Aortic valve AP dim     2.12 cm/m^2 <2.2    Peak vel,   459 cm/s   ------ index                          S                                Mean vel,   329 cm/s   ------                                S                                VTI, S      121 cm     ------                                Mean         48 mm Hg  ------                                gradient,                                S  Peak         84 mm Hg  ------                                gradient,                                S                                VTI ratio  0.18        ------                                LVOT/AV                                Area, VTI  1.05 cm^2   ------                                Area index 0.45 cm^2/m ------                                (VTI)           ^2                                Peak vel   0.19        ------                                ratio,                                LVOT/AV                                 Area index 0.47 cm^2/m ------                                (Vmax)          ^2                                Regurg PHT  545 ms     ------                                Mitral valve                                Peak E vel 90.3 cm/s   ------  Peak A vel 52.3 cm/s   ------                                Decelerati  299 ms     150-23                                on time                0                                Peak          3 mm Hg  ------                                gradient,                                D                                Peak E/A    1.7        ------                                ratio                                Tricuspid valve                                Regurg      304 cm/s   ------                                peak vel                                Peak RV-RA   37 mm Hg  ------                                gradient,                                S                                Systemic veins                                Estimated     5 mm Hg  ------  CVP                                Right ventricle                                Sa vel,    13.4 cm/s   ------                                lat ann,                                tiss DP   ------------------------------------------------------------ Adonis Huguenin, [place] [id number]   Repeat transthoracic echocardiogram performed today in the heart and vascular center is reviewed directly with Dr. Excell Seltzer. The patient's left ventricular systolic function appears essentially normal now that he is maintaining sinus rhythm. There is moderate left ventricular hypertrophy. The patient has severe aortic stenosis. Aortic valve is tricuspid and all 3 leaflets are severely restricted and heavily calcified. The peak velocity across the aortic valve is approximately 4 m/sec. The left  ventricular outflow tract is relatively large as is the patient's proximal ascending aorta. Left ventricular output tract measures approximately 2.7 cm in diameter. As a result, the calculated valve area is between 1.0 and 1.2 cm.   Impression:  Severe symptomatic aortic stenosis with severe two-vessel coronary artery disease. The patient initially presented with congestive heart failure in may of this year in the setting of rapid atrial fibrillation. 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 The patient is currently maintaining sinus rhythm since cardioversion last month. Options include continued medical therapy with close followup versus aortic valve replacement with coronary artery bypass grafting and possible Maze procedure versus transcatheter aortic valve replacement with continued medical therapy for the patient's underlying coronary artery disease and atrial fibrillation.  Risks associated with conventional surgery will be somewhat elevated but in my opinion not overwhelmingly so. The patient does have long-standing type 2 diabetes, arthritis, and an underlying tremor and weakness that is relatively mild but might represent underlying chronic neurologic disease. Despite this the patient continues to remain reasonably active physically under of the circumstances.    Plan:  The patient was seen and evaluated in conjunction with Dr. Excell Seltzer in the multidisciplinary heart valve clinic. I spent in excess of 30 minutes reviewing treatment options with the patient and his wife. Alternative treatment strategies been discussed. They understand the somewhat increased risks associated with surgical intervention as well as the very significant possibility that he may require at least temporary placement in a rehabilitation facility or skilled nursing facility for  his postoperative convalescence depending upon his physical recovery.  The patient understands and accepts all potential associated risks of surgery including but not limited to risk of death, stroke, myocardial infarction, congestive heart failure, respiratory failure, renal failure, bleeding requiring blood transfusion and/or reexploration, arrhythmia, heart block or bradycardia requiring permanent pacemaker, pneumonia, pleural effusion, wound infection, pulmonary embolus or other thromboembolic complication, chronic pain or other delayed complications.  We tentatively plan for surgery on Wednesday, October 9. The patient will stop his Eliquis 4 days prior to surgery. We will not start  amiodarone prophylactically because of the patient's baseline relatively slow heart rate. The patient will return to see me in the office on Monday, October 7 for final followup prior to surgery.  We will also obtain non-contrast CT scan of chest to better evaluate the patient's ascending aorta, which is somewhat dilated and may be significanlty diseased with atherosclerosis.  All his questions been addressed.     Salvatore Decent. Cornelius Moras, MD 04/11/2012 3:29 PM

## 2012-04-11 NOTE — Patient Instructions (Signed)
Stop Eliquis on Saturday October 5th in anticipation of surgery

## 2012-04-12 NOTE — Addendum Note (Signed)
Encounter addended by: Purcell Nails, MD on: 04/12/2012  7:49 AM<BR>     Documentation filed: Notes Section, Charting, Inpatient Notes

## 2012-04-14 ENCOUNTER — Other Ambulatory Visit: Payer: Self-pay | Admitting: *Deleted

## 2012-04-14 DIAGNOSIS — I359 Nonrheumatic aortic valve disorder, unspecified: Secondary | ICD-10-CM

## 2012-04-14 NOTE — Addendum Note (Signed)
Encounter addended by: Purcell Nails, MD on: 04/14/2012  9:37 AM<BR>     Documentation filed: Notes Section

## 2012-04-16 ENCOUNTER — Encounter (HOSPITAL_COMMUNITY): Payer: Self-pay | Admitting: Pharmacy Technician

## 2012-04-17 ENCOUNTER — Encounter (HOSPITAL_COMMUNITY): Payer: Self-pay | Admitting: Pharmacy Technician

## 2012-04-28 ENCOUNTER — Ambulatory Visit (HOSPITAL_COMMUNITY)
Admission: RE | Admit: 2012-04-28 | Discharge: 2012-04-28 | Disposition: A | Discharge status: Home / Self Care | Payer: Medicare Other | Source: Ambulatory Visit | Attending: Thoracic Surgery (Cardiothoracic Vascular Surgery) | Admitting: Thoracic Surgery (Cardiothoracic Vascular Surgery)

## 2012-04-28 ENCOUNTER — Ambulatory Visit (INDEPENDENT_AMBULATORY_CARE_PROVIDER_SITE_OTHER): Payer: Medicare Other | Admitting: Thoracic Surgery (Cardiothoracic Vascular Surgery)

## 2012-04-28 ENCOUNTER — Ambulatory Visit
Admission: RE | Admit: 2012-04-28 | Discharge: 2012-04-28 | Disposition: A | Discharge status: Home / Self Care | Payer: Medicare Other | Source: Ambulatory Visit | Attending: Thoracic Surgery (Cardiothoracic Vascular Surgery) | Admitting: Thoracic Surgery (Cardiothoracic Vascular Surgery)

## 2012-04-28 ENCOUNTER — Encounter (HOSPITAL_COMMUNITY)
Admission: RE | Admit: 2012-04-28 | Discharge: 2012-04-28 | Disposition: A | Discharge status: Home / Self Care | Payer: Medicare Other | Source: Ambulatory Visit | Attending: Thoracic Surgery (Cardiothoracic Vascular Surgery) | Admitting: Thoracic Surgery (Cardiothoracic Vascular Surgery)

## 2012-04-28 ENCOUNTER — Encounter: Payer: Self-pay | Admitting: Thoracic Surgery (Cardiothoracic Vascular Surgery)

## 2012-04-28 ENCOUNTER — Encounter (HOSPITAL_COMMUNITY): Payer: Self-pay

## 2012-04-28 VITALS — BP 130/73 | HR 60 | Resp 18 | Ht 72.0 in | Wt 256.0 lb

## 2012-04-28 DIAGNOSIS — I359 Nonrheumatic aortic valve disorder, unspecified: Secondary | ICD-10-CM

## 2012-04-28 DIAGNOSIS — I6529 Occlusion and stenosis of unspecified carotid artery: Secondary | ICD-10-CM | POA: Insufficient documentation

## 2012-04-28 DIAGNOSIS — I4891 Unspecified atrial fibrillation: Secondary | ICD-10-CM

## 2012-04-28 DIAGNOSIS — Z01812 Encounter for preprocedural laboratory examination: Secondary | ICD-10-CM | POA: Insufficient documentation

## 2012-04-28 DIAGNOSIS — I509 Heart failure, unspecified: Secondary | ICD-10-CM

## 2012-04-28 DIAGNOSIS — I2510 Atherosclerotic heart disease of native coronary artery without angina pectoris: Secondary | ICD-10-CM | POA: Insufficient documentation

## 2012-04-28 DIAGNOSIS — I350 Nonrheumatic aortic (valve) stenosis: Secondary | ICD-10-CM

## 2012-04-28 DIAGNOSIS — Z01810 Encounter for preprocedural cardiovascular examination: Secondary | ICD-10-CM | POA: Insufficient documentation

## 2012-04-28 LAB — COMPREHENSIVE METABOLIC PANEL
AST: 19 U/L (ref 0–37)
BUN: 16 mg/dL (ref 6–23)
CO2: 22 mEq/L (ref 19–32)
Chloride: 104 mEq/L (ref 96–112)
Creatinine, Ser: 1.05 mg/dL (ref 0.50–1.35)
GFR calc Af Amer: 77 mL/min — ABNORMAL LOW (ref >90)
GFR calc non Af Amer: 66 mL/min — ABNORMAL LOW (ref >90)
Glucose, Bld: 104 mg/dL — ABNORMAL HIGH (ref 70–99)
Total Bilirubin: 0.8 mg/dL (ref 0.3–1.2)

## 2012-04-28 LAB — CBC
HCT: 36.4 % — ABNORMAL LOW (ref 39.0–52.0)
Hemoglobin: 11.8 g/dL — ABNORMAL LOW (ref 13.0–17.0)
MCH: 31.2 pg (ref 26.0–34.0)
MCV: 96.3 fL (ref 78.0–100.0)
RBC: 3.78 MIL/uL — ABNORMAL LOW (ref 4.22–5.81)
WBC: 9.3 10*3/uL (ref 4.0–10.5)

## 2012-04-28 LAB — URINALYSIS, ROUTINE W REFLEX MICROSCOPIC
Bilirubin Urine: NEGATIVE
Glucose, UA: NEGATIVE mg/dL
Hgb urine dipstick: NEGATIVE
Ketones, ur: NEGATIVE mg/dL
Leukocytes, UA: NEGATIVE
Nitrite: NEGATIVE
Protein, ur: NEGATIVE mg/dL
Specific Gravity, Urine: 1.016 (ref 1.005–1.030)
Urobilinogen, UA: 0.2 mg/dL (ref 0.0–1.0)
pH: 5 (ref 5.0–8.0)

## 2012-04-28 LAB — BLOOD GAS, ARTERIAL
Acid-Base Excess: 0.9 mmol/L (ref 0.0–2.0)
O2 Saturation: 95.3 %
TCO2: 25.9 mmol/L (ref 0–100)
pCO2 arterial: 37.6 mmHg (ref 35.0–45.0)
pO2, Arterial: 77.4 mmHg — ABNORMAL LOW (ref 80.0–100.0)

## 2012-04-28 LAB — HEMOGLOBIN A1C
Hgb A1c MFr Bld: 7.3 % — ABNORMAL HIGH (ref <5.7)
Mean Plasma Glucose: 163 mg/dL — ABNORMAL HIGH (ref <117)

## 2012-04-28 LAB — ABO/RH: ABO/RH(D): A POS

## 2012-04-28 NOTE — H&P (Signed)
CARDIOTHORACIC SURGERY HISTORY AND PHYSICAL EXAM  Referring Provider is Duke Salvia, MD Primary Cardiologist is Jamse Mead, MD PCP is Yates Decamp, MD   Chief Complaint:  Exertional shortness of breath and generalized weakness  HPI:  Patient is a 78 year old white male from [facility] with aortic stenosis, persistent atrial fibrillation, congestive heart failure, hypertension, type 2 diabetes mellitus, and degenerative arthritis. The patient describes a several year gradual decline in his overall functional status that seems to be multifactorial. He has developed progressive exertional shortness of breath as well as generalized weakness. He initially presented in early May of this year to see Dr. Darrold Junker in [place] where he was noted to be in congestive heart failure. He was admitted to the hospital at [facility] during which time he had an echocardiogram and left and right heart catheterization. He was noted to have dilated nonischemic cardiomyopathy with ejection fraction estimated 24% by catheterization. At the time his aortic stenosis was felt not to be significant. Although full details of that hospitalization are not currently available, he apparently improved symptomatically with diuresis of more than 25 pounds off his baseline weight.  He was later referred to Dr. Graciela Husbands for possible placement of a defibrillator because of his underlying cardiomyopathy. A follow up echocardiogram was performed that revealed some improvement of his ejection fraction to 40-45% and what appeared to be severe aortic stenosis with peak and mean transvalvular gradients of 51 and 29 mmHg, respectively with calculated valve area estimated to be 0.8 cm2.  He was referred to the multidisciplinary heart valve clinic to discuss possible treatment options for management of severe symptomatic aortic stenosis.  The patient describes a gradual progression of symptoms of exertional  shortness of breath. He denies resting shortness of breath, PND, and orthopnea. He has had some problems with lower extremity edema, but this has been improved on medical therapy. He has never had any chest pain or chest tightness either with activity or at rest. He has not had any dizzy spells nor syncope. From a functional standpoint the patient is also limited to generalized weakness. The patient has chronic pain in his lower back left hip and leg which limits him some, and he also reports having some difficulty getting up from a chair to stand due to weakness in his legs.  However, he states that with ambulation his primary limitation is shortness of breath. He also has complaints of worsening intention tremor and generalized weakness. He states that beyond this just doesn't feel like doing much of anything any longer and he doesn't know why.  He was originally seen in the multidisciplinary heart valve clinic on 03/14/2012. Since then he underwent DC cardioversion by Dr. Darrold Junker. Patient returns to the multidisciplinary heart valve clinic last month and underwent followup echocardiogram which revealed normalization of LV systolic function. He reported that since cardioversion he doesn't really feel any better than he did prior to cardioversion. His primary complaint remains that he gives out and get short of breath with physical activity. He has not had resting shortness of breath. He has not had any chest discomfort but he does note that occasionally he gets dull aching pain radiating up into his jaw. He states that this occurs primarily if he is under stress or aggravated, and episodes typically only last for a few minutes and all much longer. He has not had any dizzy spells or syncope. He denies any PND, orthopnea, or lower  extremity edema.  The remainder of his review of systems is unchanged from previously.    Past Medical History  Diagnosis Date  . Chronic systolic heart failure   . Diabetes  mellitus   . Coronary artery disease     Two vessel CAD; 60% stenosis proximal LAD, 90% stenosis distal LAD, occluded posterior descending artery, 50% stenosis mid left circumflex with LVF of 25%.  . Aortic stenosis   . History of stomach ulcers   . Nonischemic cardiomyopathy     With concomitant coronary disease  . CHF (congestive heart failure)   . Atrial fibrillation     cardioversion- 03/2012  . Anxiety     pt. reports that he has problems with his nerves, states  he has tremors  . Heart murmur   . GERD (gastroesophageal reflux disease)     took nexium at one time   . Neuromuscular disorder     quivering & shaking in hands, states he can hardly write his name, states he saw Neurologist  but states he wasn't happy with that care & he "took himself off  of the MD appt. list"  . Arthritis     Rheumatoid arthritis - told by Dr. Gavin Potters , also reports that he has osteoporosis & osteoarthritis , states he has ddd  . Cancer     skin Ca  . Hypertension     cardiology care /w Dr. Graciela Husbands     Past Surgical History  Procedure Date  . Cardiac catheterization 11/26/2011    No significant aortic stenosis by Cardiac Cath   . Rectal fistula      repair & hemorrhoidectomy  . Eye surgery     cataract removed- R/ w IOL    Family History  Problem Relation Age of Onset  . Heart disease Brother     1/4  . Cancer Father   . Emphysema Sister     1/2  . Cancer Sister     2/2  . Hernia Brother     2/4  . Heart disease Brother     3/4    Social History History  Substance Use Topics  . Smoking status: Former Smoker -- 1.5 packs/day for 10 years    Quit date: 06/23/1964  . Smokeless tobacco: Not on file  . Alcohol Use: No    Prior to Admission medications   Medication Sig Start Date End Date Taking? Authorizing Provider  apixaban (ELIQUIS) 5 MG TABS tablet Take by mouth 2 (two) times daily.   Yes Historical Provider, MD  calcium-vitamin D (OSCAL WITH D) 500-200 MG-UNIT per tablet  Take 1.5 tablets by mouth 2 (two) times daily.   Yes Historical Provider, MD  digoxin (LANOXIN) 0.125 MG tablet Take 0.125 mg by mouth daily before breakfast. 02/06/12 02/05/13 Yes Duke Salvia, MD  furosemide (LASIX) 40 MG tablet Take 20-40 mg by mouth daily. Take 1 tablet every other day, alternating with 2 tablets on "off" days.   Yes Historical Provider, MD  glimepiride (AMARYL) 2 MG tablet Take 2 mg by mouth daily before breakfast.    Yes Historical Provider, MD  metFORMIN (GLUCOPHAGE) 850 MG tablet Take 850 mg by mouth 2 (two) times daily with a meal.   Yes Historical Provider, MD  metoprolol succinate (TOPROL-XL) 100 MG 24 hr tablet Take 100 mg by mouth daily before breakfast. Take with or immediately following a meal.   Yes Historical Provider, MD  ramipril (ALTACE) 10 MG capsule Take 10 mg by  mouth daily.   Yes Historical Provider, MD  simvastatin (ZOCOR) 40 MG tablet Take 40 mg by mouth every evening.   Yes Historical Provider, MD  traMADol-acetaminophen (ULTRACET) 37.5-325 MG per tablet Take 1 tablet by mouth every 6 (six) hours as needed. For pain.   Yes Historical Provider, MD  zinc gluconate 50 MG tablet Take 50 mg by mouth daily.   Yes Historical Provider, MD    Allergies  Allergen Reactions  . Avandia (Rosiglitazone) Other (See Comments)    unknown    Review of Systems:              General:                      normal appetite, decreased energy, + weight gain, no weight loss, no fever             Cardiac:                      no chest pain with exertion, no chest pain at rest, +SOB with minimal exertion, no resting SOB, no PND, no orthopnea, no palpitations, + arrhythmia, + atrial fibrillation, + LE edema, no dizzy spells, no syncope             Respiratory:                + shortness of breath, no home oxygen, + productive cough, no dry cough, no bronchitis, no wheezing, no hemoptysis, no asthma, no pain with inspiration or cough, no sleep apnea, no CPAP at night              GI:                                occasional difficulty swallowing, + reflux, no frequent heartburn, + hiatal hernia, no abdominal pain, no constipation, no diarrhea, no hematochezia, no hematemesis, no melena             GU:                              no dysuria,  + frequency, no urinary tract infection, no hematuria, + enlarged prostate with elevated PSA level, no kidney stones, no kidney disease             Vascular:                     no pain suggestive of claudication, no pain in feet, + leg cramps left leg, no varicose veins, no DVT, no non-healing foot ulcer             Neuro:                         no stroke, no TIA's, no seizures, no headaches, notemporary blindness one eye,  no slurred speech, + peripheral neuropathy, + chronic pain, + instability of gait, + intention tremor, no memory/cognitive dysfunction             Musculoskeletal:         + arthritis primarily lower back and left hip/leg pain, no joint swelling, no myalgias, + difficulty walking, mildly limited mobility               Skin:  no rash, + itching, no skin infections, no pressure sores or ulcerations             Psych:                         + anxiety, + depression, + nervousness, no unusual recent stress             Eyes:                           + blurry vision, no floaters, + recent vision changes, + wears glasses or contacts             ENT:                            NO hearing loss, NO loose or painful teeth, + dentures, last saw dentist mAY 2013             Hematologic:               + easy bruising, NO abnormal bleeding, NOclotting disorder, NO frequent epistaxis             Endocrine:                   + diabetes, does not check CBG's at home every morning                                   Physical Exam:              BP 117/67  Pulse 84  Resp 18  Ht 6' (1.829 m)  Wt 254 lb 6.4 oz (115.395 kg)  BMI 34.50 kg/m2  SpO2 97%             General:                      Obese,   Somewhat frail-appearing             HEENT:                       Unremarkable               Neck:                           no JVD, no bruits, no adenopathy               Chest:                         clear to auscultation, symmetrical breath sounds, no wheezes, no rhonchi               CV:                              Irregular rate and rhythm, soft grade III/VI systolic murmur               Abdomen:                    soft, non-tender, no masses               Extremities:  warm, well-perfused, pulses not palpable, + trace LE edema             Rectal/GU                   Deferred             Neuro:                         Grossly non-focal and symmetrical throughout             Skin:                            Clean and dry, no rashes, no breakdown   Diagnostic Tests:  Transthoracic Echocardiography  Patient: Christopher Walker, Christopher Walker MR #: [id number] Study Date: 02/13/2012 Gender: M Age: 25 Height: 182.9cm Weight: 116.1kg BSA: 2.60m^2 Pt. Status: Room:  Mayer Camel MD REFERRING Tonny Bollman SONOGRAPHER Missy Al-Rammal, RVT, RDCS PERFORMING Corinda Gubler, [place] REFERRING Excell Seltzer ATTENDING Baldo Daub Rowe Clack cc:  ------------------------------------------------------------ LV EF: 40% - 45%  ------------------------------------------------------------ History: PMH: Murmur. Atrial fibrillation. Cardiomyopathy of unknown etiology. Aortic valve disease. Risk factors: s/p cardiac cath at [facility] in 11/2011: severely dilated cardiomyopathy, EF 24%, AVA 1.2.  2D Echo at [facility] 02/11/12: moderate LV dysfunction, moderate LVH, EF 30-35%, severe AS, mean gradient 25 mmHg, AVA 0.78-1.06 cm2 Hypertension. Diabetes mellitus.  ------------------------------------------------------------ Study Conclusions  - Left ventricle: The cavity size was mildly dilated. There was moderate concentric hypertrophy. Systolic  function was mildly to moderately reduced. The estimated ejection fraction was in the range of 40% to 45%. Wall motion was normal; there were no regional wall motion abnormalities. Left ventricular diastolic function parameters were normal. - Aortic valve: Transvalvular velocity was increased. There was moderate stenosis. Mild to Moderate regurgitation. Mean gradient: 29mm Hg (S). Peak gradient: 51mm Hg (S). Peak velocity 357 cm/sec - Mitral valve: Mild regurgitation. - Left atrium: The atrium was mildly dilated. - Tricuspid valve: Mild to Moderate regurgitation. - Pulmonary arteries: PA peak pressure: 42mm Hg (S). Transthoracic echocardiography. M-mode, complete 2D, spectral Doppler, and color Doppler. Height: Height: 182.9cm. Height: 72in. Weight: Weight: 116.1kg. Weight: 255.5lb. Body mass index: BMI: 34.7kg/m^2. Body surface area: BSA: 2.25m^2. Blood pressure: 120/80. Patient status: Outpatient.  ------------------------------------------------------------  ------------------------------------------------------------ Left ventricle: The cavity size was mildly dilated. There was moderate concentric hypertrophy. Systolic function was mildly to moderately reduced. The estimated ejection fraction was in the range of 40% to 45%. Wall motion was normal; there were no regional wall motion abnormalities. The transmitral flow pattern was normal. The deceleration time of the early transmitral flow velocity was normal. The pulmonary vein flow pattern was normal. The tissue Doppler parameters were normal. Left ventricular diastolic function parameters were normal.  ------------------------------------------------------------ Aortic valve: Mildlythickened, severely calcified leaflets. Mobility was not restricted. Doppler: Transvalvular velocity was increased. There was moderate stenosis. Mild to Moderate regurgitation. VTI ratio of LVOT to aortic valve: 0.21. Valve area: 0.87cm^2(VTI).  Indexed valve area: 0.37cm^2/m^2 (VTI). Peak velocity ratio of LVOT to aortic valve: 0.21. Valve area: 0.86cm^2 (Vmax). Mean gradient: 29mm Hg (S). Peak gradient: 51mm Hg (S). Peak velocity 357 cm/sec  ------------------------------------------------------------ Aorta: The aorta was normal, not dilated, and non-diseased. Aortic root: The aortic root was mildly dilated. Ascending aorta: The ascending aorta was normal in size.  ------------------------------------------------------------ Mitral valve: Structurally normal valve. Mobility was not restricted. Doppler: Transvalvular velocity was within the  normal range. There was no evidence for stenosis. Mild regurgitation. Valve area by pressure half-time: 2.78cm^2. Indexed valve area by pressure half-time: 1.17cm^2/m^2. Peak gradient: 2mm Hg (D).  ------------------------------------------------------------ Left atrium: The atrium was mildly dilated.  ------------------------------------------------------------ Right ventricle: The cavity size was normal. Wall thickness was normal. Systolic function was normal.  ------------------------------------------------------------ Pulmonic valve: Structurally normal valve. Cusp separation was normal. Doppler: Transvalvular velocity was within the normal range. There was no evidence for stenosis. Trivial regurgitation.  ------------------------------------------------------------ Tricuspid valve: Structurally normal valve. Doppler: Transvalvular velocity was within the normal range. Mild to Moderate regurgitation.  ------------------------------------------------------------ Pulmonary artery: The main pulmonary artery was normal-sized. RVSP elevated consistent with mild pulmonary HTN.  ------------------------------------------------------------ Right atrium: The atrium was normal in size.  ------------------------------------------------------------ Pericardium: There was no  pericardial effusion.  ------------------------------------------------------------ Systemic veins: Inferior vena cava: The vessel was normal in size.  ------------------------------------------------------------ Post procedure conclusions Ascending Aorta:  - The aorta was normal, not dilated, and non-diseased.  ------------------------------------------------------------  2D measurements Normal Doppler measurements Normal Left ventricle Main pulmonary LVID ED, 50.2 mm 43-52 artery chord, Pressure, 42 mm Hg =30 PLAX S LVID ES, 40 mm 23-38 LVOT chord, Peak vel, 74 cm/s ------ PLAX S FS, chord, 20 % >29 VTI, S 15.9 cm ------ PLAX Aortic valve LVPW, ED 15 mm ------ Peak vel, 357 cm/s ------ IVS/LVPW 1.2 <1.3 S ratio, ED Mean vel, 254 cm/s ------ Vol ED, 116 ml ------ S MOD1 VTI, S 75.6 cm ------ Vol ES, 53 ml ------ Mean 29 mm Hg ------ MOD1 gradient, Vol index, 49 ml/m^2 ------ S ED, MOD1 Peak 51 mm Hg ------ Vol index, 22 ml/m^2 ------ gradient, ES, MOD1 S Vol ED, 97 ml ------ VTI ratio 0.21 ------ MOD2 LVOT/AV Vol ES, 52 ml ------ Area, VTI 0.87 cm^2 ------ MOD2 Area index 0.37 cm^2/m ------ EF, MOD2 46 % ------ (VTI) ^2 Stroke 45 ml ------ Peak vel 0.21 ------ vol, MOD2 ratio, Vol index, 41 ml/m^2 ------ LVOT/AV ED, MOD2 Area, Vmax 0.86 cm^2 ------ Vol index, 22 ml/m^2 ------ Regurg PHT 586 ms ------ ES, MOD2 Mitral valve Stroke 19 ml/m^2 ------ Peak E vel 76.8 cm/s ------ index, Peak A vel 35.1 cm/s ------ MOD2 Decelerati 243 ms 150-23 Ventricular septum on time 0 IVS, ED 18 mm ------ Pressure 79 ms ------ LVOT half-time Diam, S 23 mm ------ Peak 2 mm Hg ------ Area 4.15 cm^2 ------ gradient, Aorta D Root diam, 38 mm ------ Peak E/A 2.2 ------ ED ratio Left atrium Area (PHT) 2.78 cm^2 ------ AP dim 43 mm ------ Area index 1.17 cm^2/m ------ AP dim 1.81 cm/m^2 <2.2 (PHT) ^2 index Max regurg 306 cm/s ------ Vol, S 50 ml ------ vel Vol index, 21.1 ml/m^2  ------ Regurg VTI 85 cm ------ S Tricuspid valve Regurg 284 cm/s ------ peak vel Peak RV-RA 32 mm Hg ------ gradient, S Max regurg 284 cm/s ------ vel Pulmonic valve Peak vel, 128 cm/s ------ S Regurg 72.6 cm/s ------ vel, ED  ------------------------------------------------------------ Prepared and Electronically Authenticated by  Dossie Arbour [id number]   Transthoracic Echocardiography  (Report amended )  Patient:    Christopher Walker, Christopher Walker MR #:       [id number] Study Date: 04/11/2012 Gender:     M Age:        79 Height:     182.9cm Weight:     115.2kg BSA:        2.50m^2 Pt. Status: Room:    Dora Sims  REFERRING  Tonny Bollman  ATTENDING    Shirlee Latch, [place]  PERFORMING   Redge Gainer, [facility]  SONOGRAPHER  Junious Dresser, RDCS cc:  ------------------------------------------------------------ LV EF: 55% -   60%  ------------------------------------------------------------ Indications:      425.4 Other primary Cardiomyopathies. 427.31 Atrial Fibrillation.  Aortic stenosis /insufficiency 424.1.  ------------------------------------------------------------ History:   PMH:  Acquired from the patient and from the patient's chart. Systolic murmur. 3/6 Harsh crescendo-decrescendo murmur at the right upper sternal border and left lower sternal border.  Atrial fibrillation. Coronary artery disease.  Non-ischemic cardiomyopathy, with congestive heart failure, with an ejection fraction of 45%by echocardiography. The dysfunction is primarily systolic. Moderate aortic stenosis.  Risk factors:  Former tobacco use. Hypertension. Diabetes mellitus. Obese.  ------------------------------------------------------------ Study Conclusions  - Left ventricle: Wall thickness was increased in a pattern   of moderate LVH. Systolic function was normal. The   estimated ejection fraction was in the range of 55% to   60%. Wall motion was normal; there  were no regional wall   motion abnormalities. Features are consistent with a   pseudonormal left ventricular filling pattern, with   concomitant abnormal relaxation and increased filling   pressure (grade 2 diastolic dysfunction). E/medial e' > 15   suggests LV end diastolic pressure at least 20 mmHg. - Aortic valve: Trileaflet; severely calcified leaflets.   Valve mobility was severely restricted. There was severe   stenosis. Trivial regurgitation. Mean gradient: 48mm Hg   (S). Peak gradient: 84mm Hg (S). Peak Aortic valve   gradient when patient was in bigeminy was 66 mm Hg and   mean was 41 mm Hg. Valve area:0.98cm^2(VTI). - Aorta: Ascending aortic diameter: 38mm (S). Mild dilation   ascending aorta. - Mitral valve: Trivial regurgitation. - Left atrium: The atrium was moderately dilated. - Right ventricle: The cavity size was normal. Systolic   function was normal. - Tricuspid valve: Peak RV-RA gradient: 37mm Hg (S). - Pulmonary arteries: PA peak pressure: 42mm Hg (S). - Inferior vena cava: The vessel was normal in size; the   respirophasic diameter changes were in the normal range (=   50%); findings are consistent with normal central venous   pressure. Impressions:  - Normal LV size with moderate LV hypertrophy. EF 55-60%.   Moderate diastolic dysfunction with evidence for elevated   LV filling pressure. Severe aortic stenosis with mean   gradient 48 mmHg and AVA 0.98 cm^2 (large LV outflow   tract). Normal RV size and systolic function. Mild   pulmonary hypertension.  ------------------------------------------------------------ Labs, prior tests, procedures, and surgery: Echocardiography (July 2013).    The aortic valve showed moderate stenosis and mild to moderate regurgitation. The tricuspid valve showed mild to moderate regurgitation.  EF was 45% and PA pressure was 42 (systolic). Aortic valve: peak gradient of 29mm Hg and mean gradient of 51mm Hg.  Echocardiography  (July 2013).    The aortic valve showed stenosis.  EF was 35%. Aortic valve: mean gradient of 25mm Hg. Transthoracic echocardiography.  M-mode, complete 2D, spectral Doppler, and color Doppler.  Height:  Height: 182.9cm. Height: 72in.  Weight:  Weight: 115.2kg. Weight: 253.5lb.  Body mass index:  BMI: 34.4kg/m^2.  Body surface area:    BSA: 2.72m^2.  Blood pressure:     120/75.  Patient status:  Outpatient.  Location:  [facility]  ------------------------------------------------------------  ------------------------------------------------------------ Left ventricle:   Wall thickness was increased in a pattern of moderate LVH.   Systolic function was normal. The estimated ejection fraction  was in the range of 55% to 60%. Wall motion was normal; there were no regional wall motion abnormalities. Features are consistent with a pseudonormal left ventricular filling pattern, with concomitant abnormal relaxation and increased filling pressure (grade 2 diastolic dysfunction). E/medial e' > 15 suggests LV end diastolic pressure at least 20 mmHg.  ------------------------------------------------------------ Aortic valve:   Trileaflet; severely calcified leaflets. Valve mobility was severely restricted.  Doppler:   There was severe stenosis.    Trivial regurgitation.    VTI ratio of LVOT to aortic valve: 0.18. Valve area:0.98cm^2(VTI). Indexed valve area: 0.45cm^2/m^2 (VTI). Peak velocity ratio of LVOT to aortic valve: 0.19. Indexed valve area: 0.47cm^2/m^2 (Vmax).    Mean gradient: 48mm Hg (S). Peak gradient: 84mm Hg (S).  ------------------------------------------------------------ Aorta:  Mild dilation ascending aorta.  ------------------------------------------------------------ Mitral valve:   Doppler:   There was no evidence for stenosis.    Trivial regurgitation.    Peak gradient: 3mm Hg (D).  ------------------------------------------------------------ Left atrium:  The  atrium was moderately dilated.  ------------------------------------------------------------ Right ventricle:  The cavity size was normal. Systolic function was normal.  ------------------------------------------------------------ Pulmonic valve:    Structurally normal valve.   Cusp separation was normal.  Doppler:  Transvalvular velocity was within the normal range.  Trivial regurgitation.  ------------------------------------------------------------ Tricuspid valve:   Doppler:   Mild regurgitation.  ------------------------------------------------------------ Right atrium:  The atrium was normal in size.  ------------------------------------------------------------ Pericardium:  There was no pericardial effusion.  ------------------------------------------------------------ Systemic veins: Inferior vena cava: The vessel was normal in size; the respirophasic diameter changes were in the normal range (= 50%); findings are consistent with normal central venous pressure.  ------------------------------------------------------------ Post procedure conclusions Ascending Aorta:  - Mild dilation ascending aorta.  ------------------------------------------------------------  2D measurements        Normal  Doppler measurements   Normal Left ventricle                 Main pulmonary LVID ED,   46.4 mm     43-52   artery chord,                         Pressure,    42 mm Hg  =30 PLAX                           S LVID ES,   26.6 mm     23-38   Left ventricle chord,                         Ea, lat    9.98 cm/s   ------ PLAX                           ann, tiss FS, chord,   43 %      >29     DP PLAX                           E/Ea, lat  9.05        ------ LVPW, ED   14.1 mm     ------  ann, tiss IVS/LVPW   1.45        <1.3    DP ratio, ED                      Ea, med  4.39 cm/s   ------ Ventricular septum             ann, tiss IVS, ED    20.4 mm     ------  DP LVOT                            E/Ea, med  20.5        ------ Diam, S      27 mm     ------  ann, tiss     7 Area       5.73 cm^2   ------  DP Diam         25 mm     ------  LVOT Aorta                          Peak vel,    88 cm/s   ------ Root diam,   39 mm     ------  S ED                             VTI, S     22.2 cm     ------ AAo AP       38 mm     ------  Stroke vol  109 ml     ------ diam, S                        Stroke     46.2 ml/m^2 ------ Left atrium                    index AP dim       50 mm     ------  Aortic valve AP dim     2.12 cm/m^2 <2.2    Peak vel,   459 cm/s   ------ index                          S                                Mean vel,   329 cm/s   ------                                S                                VTI, S      121 cm     ------                                Mean         48 mm Hg  ------                                gradient,                                S  Peak         84 mm Hg  ------                                gradient,                                S                                VTI ratio  0.18        ------                                LVOT/AV                                Area, VTI  1.05 cm^2   ------                                Area index 0.45 cm^2/m ------                                (VTI)           ^2                                Peak vel   0.19        ------                                ratio,                                LVOT/AV                                Area index 0.47 cm^2/m ------                                (Vmax)          ^2                                Regurg PHT  545 ms     ------                                Mitral valve                                Peak E vel 90.3 cm/s   ------  Peak A vel 52.3 cm/s   ------                                Decelerati  299 ms     150-23                                on time                0                                 Peak          3 mm Hg  ------                                gradient,                                D                                Peak E/A    1.7        ------                                ratio                                Tricuspid valve                                Regurg      304 cm/s   ------                                peak vel                                Peak RV-RA   37 mm Hg  ------                                gradient,                                S                                Systemic veins                                Estimated     5 mm Hg  ------  CVP                                Right ventricle                                Sa vel,    13.4 cm/s   ------                                lat ann,                                tiss DP   ------------------------------------------------------------ Adonis Huguenin, [place] 2013-09-20T15:36:[id number]      CARDIAC CATHETERIZATION  Report of left and right heart catheterization performed 11/26/2011 [facility] is reviewed. By report there was severe dilated cardiomyopathy with ejection fraction estimated 24%. There was multivessel coronary artery disease with 60% tubular proximal stenosis of the left anterior descending coronary artery and 90% stenosis of the distal left anterior descending coronary artery. There is 100% chronic occlusion of the posterior descending coronary artery. There was moderate disease in the left circumflex system. Mean gradient across the aortic valve was measured 18 mm mercury and the valve area was estimated to be 1.2 cm. This was interpreted as no significant aortic stenosis. Pulmonary artery pressures measured 30/17 with palmar And wedge pressure of 19 central venous pressure of 10. Cardiac output was estimated between 4.2 and 4.3 L per minute corresponding to a cardiac index of 1.8.  Images from left and  right heart catheterization performed 11/26/2011 have been directly reviewed.  The patient has severe three-vessel coronary artery disease with diffuse diabetic appearing vessels. There is long segment 60% tubular stenosis of the proximal left anterior descending coronary artery with diffuse long segment 90% stenosis of the mid and distal portion of this vessel. This vessel may not be a good target vessel for grafting. There is 100% chronic occlusion of the posterior descending coronary artery. The distal portion of the posterior ascending coronary artery is never visualized at catheterization in may not be graftable. The right coronary artery is also a diffusely diseased but there is no high-grade proximal stenosis. There is significant disease in the left circumflex system as well with at least 60-70% stenosis proximally.      Pulmonary Function Tests Baseline                                                                        FVC                 3.76 L  (87% predicted)           FEV1               3.17 L  (102% predicted)         [id number]        4.02 L  (182% predicted)          RV  2.86 L  (107% predicted) DLCO              57% predicted    6 Minute Walk Test Results  Patient:            Christopher Walker Date:               03/14/2012   Supplemental O2 during test?            no                                       Baseline                                 End  Time                            1340                                        1346 Heartrate                     84                                            86 Dyspnea                      no                                            yes Fatigue                        no                                            yes O2 sat                         97%                                         98% Blood pressure           117/67                                     134/74   Patient ambulated at a moderate  pace for a total distance of 840 feet with 4 stops.  Ambulation was limited primarily due to shortness of breath.  Overall the test was tolerated well.    STS Risk Calculator  Procedure  TAVR  Risk of Mortality                                2.724% Morbidity or Mortality                       19.088% Prolonged LOS                                   7.757% Short LOS                                           26.747% Permanent Stroke                            1.306% Prolonged Vent Support                      12.320% DSW Infection                                     0.597% Renal Failure                                       5.705% Reoperation                                        7.363%    CT CHEST WITHOUT CONTRAST   Technique: Multidetector CT imaging of the chest was performed   following the standard protocol without IV contrast.   Comparison: None.   Findings: No pathologically enlarged mediastinal or axillary lymph   nodes. Hilar regions are difficult to definitively evaluate   without IV contrast. There are aortic valvular calcifications.   Ascending aorta measures up to 3.8 cm. Transverse descending   thoracic aorta are normal in caliber. Extensive coronary artery   calcification. Heart is at the upper limits of normal in size. No   pericardial effusion. Pulmonary arteries are enlarged. Tiny hiatal   hernia.   There is very mild subpleural reticulation bilaterally. Right   lower lobe nodule measures 9 x 6 mm. Additional scattered small   subpleural nodular densities are noted. No pleural fluid. Airway   is unremarkable.   Incidental imaging of the upper abdomen shows a 2.0 x 2.3 cm low   attenuation nodule in the right adrenal gland, measuring -6   Hounsfield units. Duodenal diverticulum is incidentally noted. A   fair amount of stool is seen in the visualized colon. No worrisome   lytic or sclerotic lesions. Degenerative changes  are seen in the   spine.   IMPRESSION:   1. Aortic valvular calcifications without ascending aortic   aneurysm   2. Extensive coronary artery calcification and pulmonary arterial   hypertension.   3. Mild subpleural pulmonary fibrosis.   4. Right lower lobe nodule. If the patient is at high risk for   bronchogenic carcinoma, follow-up chest CT at 3-6 months is   recommended. If  the patient is at low risk for bronchogenic   carcinoma, follow-up chest CT at 6-12 months is recommended. This   recommendation follows the consensus statement: Guidelines for   Management of Small Pulmonary Nodules Detected on CT Scans: A   Statement from the Fleischner Society as published in Radiology   2005; 237:395-400.   Original Report Authenticated By: Reyes Ivan, M.D.    Impression:  Severe symptomatic aortic stenosis with severe two-vessel coronary artery disease. The patient initially presented with congestive heart failure in may of this year in the setting of rapid atrial fibrillation. 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 The patient is currently maintaining sinus rhythm since cardioversion. Options include continued medical therapy with close followup versus aortic valve replacement with coronary artery bypass grafting and possible Maze procedure versus transcatheter aortic valve replacement with continued medical therapy for the patient's underlying coronary artery disease and atrial fibrillation. Risks associated with conventional surgery will be somewhat elevated but in my opinion not overwhelmingly so. The patient does have long-standing type 2 diabetes, arthritis, and an underlying tremor and weakness that is relatively mild but might represent underlying chronic neurologic disease. Despite this the patient continues to remain reasonably active  physically under of the circumstances.  The patient has a small RLL lung nodule that is not particularly suspicious but probably requires follow up in 6-12 months.  Chest CT otherwise looks good with no aneurysmal enlargement of the ascending thoracic aorta and only mild calcification.   Plan:   We plan to proceed with aortic valve replacement, coronary artery bypass grafting and possible Maze procedure on Wednesday, October 9.  The patient understands and accepts all potential associated risks of surgery including but not limited to risk of death, stroke, myocardial infarction, congestive heart failure, respiratory failure, renal failure, bleeding requiring blood transfusion and/or reexploration, arrhythmia, heart block or bradycardia requiring permanent pacemaker, pneumonia, pleural effusion, wound infection, pulmonary embolus or other thromboembolic complication, chronic pain or other delayed complications. We tentatively plan for surgery on Wednesday, October 9.      Salvatore Decent. Cornelius Moras, MD

## 2012-04-28 NOTE — Progress Notes (Signed)
[address]            [place] [address]          [phone]     CARDIOTHORACIC SURGERY OFFICE NOTE  Referring Provider is Duke Salvia, MD Primary Cardiologist is Jamse Mead, MD PCP is Yates Decamp, MD   HPI:  Patient returns for followup of severe symptomatic aortic stenosis with history of atrial fibrillation, congestive heart failure, coronary artery disease, hypertension, type 2 diabetes mellitus, and arthritis. He was last seen on 04/12/2012 in the multidisciplinary heart valve clinic. He returns to the office today with tentatively plans to proceed with high-risk aortic valve replacement, coronary artery bypass grafting, and possible Maze procedure on Wednesday, October 9. He reports that since his last office visit he has remained essentially stable, although he states that he seems to be getting more short of breath with physical activity. He denies any resting shortness of breath. He has had some mild lower extremity edema. He root otherwise reports no new problems and he is eager to proceed with surgery as previously planned.   Current Outpatient Prescriptions  Medication Sig Dispense Refill  . calcium-vitamin D (OSCAL WITH D) 500-200 MG-UNIT per tablet Take 1.5 tablets by mouth 2 (two) times daily.      . digoxin (LANOXIN) 0.125 MG tablet Take 0.125 mg by mouth daily before breakfast.      . furosemide (LASIX) 40 MG tablet Take 20-40 mg by mouth daily. Take 1 tablet every other day, alternating with 2 tablets on "off" days.      Marland Kitchen glimepiride (AMARYL) 2 MG tablet Take 2 mg by mouth daily before breakfast.       . metFORMIN (GLUCOPHAGE) 850 MG tablet Take 850 mg by mouth 2 (two) times daily with a meal.      . metoprolol succinate (TOPROL-XL) 100 MG 24 hr tablet Take 100 mg by mouth daily before breakfast. Take with or immediately following a meal.      . ramipril (ALTACE) 10 MG capsule Take 10 mg by mouth daily.      . simvastatin  (ZOCOR) 40 MG tablet Take 40 mg by mouth every evening.      . traMADol-acetaminophen (ULTRACET) 37.5-325 MG per tablet Take 1 tablet by mouth every 6 (six) hours as needed. For pain.      Marland Kitchen zinc gluconate 50 MG tablet Take 50 mg by mouth daily.      Marland Kitchen DISCONTD: digoxin (LANOXIN) 0.125 MG tablet Take 1 tablet (0.125 mg total) by mouth daily.  90 tablet  3  . apixaban (ELIQUIS) 5 MG TABS tablet Take by mouth 2 (two) times daily.          Physical Exam:   BP 130/73  Pulse 60  Resp 18  Ht 6' (1.829 m)  Wt 256 lb (116.121 kg)  BMI 34.72 kg/m2  SpO2 95%  General:  Well-appearing  Chest:   Clear to auscultation with symmetrical breath sounds  CV:   Regular rate and rhythm with prominent systolic murmur  Incisions:  n/a  Abdomen:  Soft and nontender  Extremities:  Warm and well-perfused with moderate bilateral lower extremity edema  Diagnostic Tests:   Images from left and right heart catheterization performed 11/26/2011 have been directly reviewed.  The patient has severe three-vessel coronary artery disease with diffuse diabetic appearing vessels. There is long segment 60%  tubular stenosis of the proximal left anterior descending coronary artery with diffuse long segment 90% stenosis of the mid and distal portion of this vessel. This vessel may not be a good target vessel for grafting. There is 100% chronic occlusion of the posterior descending coronary artery. The distal portion of the posterior ascending coronary artery is never visualized at catheterization in may not be graftable. The right coronary artery is also a diffusely diseased but there is no high-grade proximal stenosis. There is significant disease in the left circumflex system as well with at least 60-70% stenosis proximally. Left ventricular function was severely reduced with ejection fraction estimated 24% at that time.   Results for DEKARI, KOETTING (MRN [id number]) as of 04/28/2012 15:54  Ref. Range 04/28/2012 14:00  Sample  type No range found ARTERIAL DRAW  Mode No range found ROOM AIR  O2 Content No range found 21.0  pH, Arterial Latest Range: 7.350-7.450  7.433  pCO2 arterial Latest Range: 35.0-45.0 mmHg 37.6  pO2, Arterial Latest Range: 80.0-100.0 mmHg 77.4 (L)  Bicarbonate Latest Range: 20.0-24.0 mEq/L 24.7 (H)  TCO2 Latest Range: 0-100 mmol/L 25.9  Acid-Base Excess Latest Range: 0.0-2.0 mmol/L 0.9  O2 Saturation No range found 95.3  Patient temperature No range found 98.6  Collection site No range found LEFT RADIAL  Allens test (pass/fail) Latest Range: PASS  PASS  Sodium Latest Range: 134-144 mmol/L 138  Potassium Latest Range: 3.5-5.1 mEq/L 4.4  Chloride Latest Range: 96-112 mEq/L 104  CO2 Latest Range: 19-32 mEq/L 22  BUN Latest Range: 8-27 mg/dL 16  Creatinine Latest Range: 0.50-1.35 mg/dL 4.09  Calcium Latest Range: 8.4-10.5 mg/dL 9.7  GFR calc non Af Amer Latest Range: >90 mL/min 66 (L)  GFR calc Af Amer Latest Range: >90 mL/min 77 (L)  Glucose Latest Range: 70-99 mg/dL 811 (H)  Alkaline Phosphatase Latest Range: 39-117 U/L 65  Albumin Latest Range: 3.5-5.2 g/dL 4.0  AST Latest Range: 0-37 U/L 19  ALT Latest Range: 0-53 U/L 13  Total Protein Latest Range: 6.0-8.3 g/dL 7.2  Total Bilirubin Latest Range: 0.3-1.2 mg/dL 0.8  WBC Latest Range: 4.0-10.5 K/uL 9.3  RBC Latest Range: 4.22-5.81 MIL/uL 3.78 (L)  Hemoglobin Latest Range: 13.0-17.0 g/dL 91.4 (L)  HCT Latest Range: 39.0-52.0 % 36.4 (L)  MCV Latest Range: 78.0-100.0 fL 96.3  MCH Latest Range: 26.0-34.0 pg 31.2  MCHC Latest Range: 30.0-36.0 g/dL 78.2  RDW Latest Range: 11.5-15.5 % 15.0  Platelets Latest Range: 150-400 K/uL 208  Prothrombin Time Latest Range: 11.6-15.2 seconds 13.6  INR Latest Range: 0.00-1.49  1.05  aPTT Latest Range: 24-37 seconds 31   *RADIOLOGY REPORT*  Clinical Data: Preop aortic valve surgery. Evaluate ascending  aorta.  CT CHEST WITHOUT CONTRAST  Technique: Multidetector CT imaging of the chest was  performed  following the standard protocol without IV contrast.  Comparison: None.  Findings: No pathologically enlarged mediastinal or axillary lymph  nodes. Hilar regions are difficult to definitively evaluate  without IV contrast. There are aortic valvular calcifications.  Ascending aorta measures up to 3.8 cm. Transverse descending  thoracic aorta are normal in caliber. Extensive coronary artery  calcification. Heart is at the upper limits of normal in size. No  pericardial effusion. Pulmonary arteries are enlarged. Tiny hiatal  hernia.  There is very mild subpleural reticulation bilaterally. Right  lower lobe nodule measures 9 x 6 mm. Additional scattered small  subpleural nodular densities are noted. No pleural fluid. Airway  is unremarkable.  Incidental imaging of the upper abdomen shows  a 2.0 x 2.3 cm low  attenuation nodule in the right adrenal gland, measuring -6  Hounsfield units. Duodenal diverticulum is incidentally noted. A  fair amount of stool is seen in the visualized colon. No worrisome  lytic or sclerotic lesions. Degenerative changes are seen in the  spine.  IMPRESSION:  1. Aortic valvular calcifications without ascending aortic  aneurysm  2. Extensive coronary artery calcification and pulmonary arterial  hypertension.  3. Mild subpleural pulmonary fibrosis.  4. Right lower lobe nodule. If the patient is at high risk for  bronchogenic carcinoma, follow-up chest CT at 3-6 months is  recommended. If the patient is at low risk for bronchogenic  carcinoma, follow-up chest CT at 6-12 months is recommended. This  recommendation follows the consensus statement: Guidelines for  Management of Small Pulmonary Nodules Detected on CT Scans: A  Statement from the Fleischner Society as published in Radiology  2005; 237:395-400.  Original Report Authenticated By: Reyes Ivan, M.D.   Impression:  Severe symptomatic aortic stenosis with severe two-vessel coronary  artery disease. The patient initially presented with congestive heart failure in may of this year in the setting of rapid atrial fibrillation. 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 The patient is currently maintaining sinus rhythm since cardioversion last month. Options include continued medical therapy with close followup versus aortic valve replacement with coronary artery bypass grafting and possible Maze procedure versus transcatheter aortic valve replacement with continued medical therapy for the patient's underlying coronary artery disease and atrial fibrillation. Risks associated with conventional surgery will be somewhat elevated but in my opinion not overwhelmingly so. The patient does have long-standing type 2 diabetes, arthritis, and an underlying tremor and weakness that is relatively mild but might represent underlying chronic neurologic disease. Despite this the patient continues to remain reasonably active physically under of the circumstances.  The patient has a small RLL lung nodule that is not particularly suspicious but probably requires follow up in 6-12 months.  Chest CT otherwise looks good with no aneurysmal enlargement of the ascending thoracic aorta and only mild calcification.   Plan:   We plan to proceed with aortic valve replacement, coronary artery bypass grafting and possible Maze procedure on Wednesday, October 9.  The patient understands and accepts all potential associated risks of surgery including but not limited to risk of death, stroke, myocardial infarction, congestive heart failure, respiratory failure, renal failure, bleeding requiring blood transfusion and/or reexploration, arrhythmia, heart block or bradycardia requiring permanent pacemaker, pneumonia, pleural effusion, wound infection, pulmonary embolus or other  thromboembolic complication, chronic pain or other delayed complications. We tentatively plan for surgery on Wednesday, October 9.   Salvatore Decent. Cornelius Moras, MD 04/28/2012 3:51 PM

## 2012-04-28 NOTE — Progress Notes (Signed)
VASCULAR LAB PRELIMINARY  PRELIMINARY  PRELIMINARY  PRELIMINARY  Pre-op Cardiac Surgery  Carotid Findings:  Right:  No evidence of significant ICA stenosis.  Left:  60-79% ICA stenosis.  Bilateral:  Vertebral artery flow is antegrade.    Upper Extremity Right Left  Brachial Pressures 110 T 114 M  Radial Waveforms T T  Ulnar Waveforms T M  Palmar Arch (Allen's Test) WNL WNL   Findings:  Doppler waveforms remain normal with ulnar and radial compressions bilaterally.    Lower  Extremity Right Left  Dorsalis Pedis 61 M 101 M  Anterior Tibial    Posterior Tibial 25 Severe DM 127 M  Ankle/Brachial Indices 0.69 >1.0    Findings:  Right:  ABI indicates a moderate reduction in arterial flow.  Left:  ABI is within normal limits with abnormal Doppler waveforms.   Dessire Grimes, 04/28/2012, 12:38 PM

## 2012-04-28 NOTE — Pre-Procedure Instructions (Signed)
20 Christopher Walker  04/28/2012   Your procedure is scheduled on:  04/30/2012  Report to [facility] at 6:30 AM.  Call this number if you have problems the morning of surgery: [phone]   Remember:   Do not eat food or drink liquid After Midnight.    Take these medicines the morning of surgery with A SIP OF WATER: DIGOXIN, METOPROLOL   Do not wear jewelry, make-up or nail polish.     Do not wear lotions, powders, or perfumes. You may wear deodorant.   Do not shave 48 hours prior to surgery. Men may shave face and neck.   Do not bring valuables to the hospital.   Contacts, dentures or bridgework may not be worn into surgery.   Leave suitcase in the car. After surgery it may be brought to your room.  For patients admitted to the hospital, checkout time is 11:00 AM the day of discharge.   Patients discharged the day of surgery will not be allowed to drive home.  Name and phone number of your driver: /w wife  Special Instructions: Incentive Spirometry - Practice and bring it with you on the day of surgery.                            OBSERVE SPECIAL SHOWER INSTRUCTIONS   Please read over the following fact sheets that you were given: Pain Booklet, Coughing and Deep Breathing, Blood Transfusion Information, Total Joint Packet and MRSA Information

## 2012-04-28 NOTE — Patient Instructions (Signed)
Take metoprolol and digoxin on the morning of surgery with sip of water.  Do not take any other medications that morning.  Do not take Eliquis (apixaban) or any other blood thinners between now and surgery.

## 2012-04-29 MED ORDER — MAGNESIUM SULFATE 50 % IJ SOLN
40.0000 meq | INTRAMUSCULAR | Status: DC
  Filled 2012-04-29: qty 10

## 2012-04-29 MED ORDER — DEXMEDETOMIDINE HCL IN NACL 400 MCG/100ML IV SOLN
0.1000 ug/kg/h | INTRAVENOUS | Status: AC
  Administered 2012-04-30: 0.2 ug/kg/h via INTRAVENOUS
  Filled 2012-04-29: qty 100

## 2012-04-29 MED ORDER — SODIUM CHLORIDE 0.9 % IV SOLN
INTRAVENOUS | Status: AC
  Administered 2012-04-30: 1 [IU]/h via INTRAVENOUS
  Filled 2012-04-29: qty 1

## 2012-04-29 MED ORDER — NITROGLYCERIN IN D5W 200-5 MCG/ML-% IV SOLN
2.0000 ug/min | INTRAVENOUS | Status: AC
  Administered 2012-04-30: 5 ug/min via INTRAVENOUS
  Filled 2012-04-29: qty 250

## 2012-04-29 MED ORDER — SODIUM CHLORIDE 0.9 % IV SOLN
INTRAVENOUS | Status: AC
  Administered 2012-04-30: 70 mL/h via INTRAVENOUS
  Filled 2012-04-29: qty 40

## 2012-04-29 MED ORDER — POTASSIUM CHLORIDE 2 MEQ/ML IV SOLN
80.0000 meq | INTRAVENOUS | Status: DC
  Filled 2012-04-29: qty 40

## 2012-04-29 MED ORDER — PLASMA-LYTE 148 IV SOLN
INTRAVENOUS | Status: AC
  Administered 2012-04-30: 10:00:00
  Filled 2012-04-29: qty 2.5

## 2012-04-29 MED ORDER — DEXTROSE 5 % IV SOLN
750.0000 mg | INTRAVENOUS | Status: DC
  Filled 2012-04-29: qty 750

## 2012-04-29 MED ORDER — DEXTROSE 5 % IV SOLN
1.5000 g | INTRAVENOUS | Status: AC
  Administered 2012-04-30: 1.5 g via INTRAVENOUS
  Administered 2012-04-30: .75 g via INTRAVENOUS
  Filled 2012-04-29 (×2): qty 1.5

## 2012-04-29 MED ORDER — VANCOMYCIN HCL 1000 MG IV SOLR
1500.0000 mg | INTRAVENOUS | Status: AC
  Administered 2012-04-30: 1500 mg via INTRAVENOUS
  Filled 2012-04-29: qty 1500

## 2012-04-29 MED ORDER — PHENYLEPHRINE HCL 10 MG/ML IJ SOLN
30.0000 ug/min | INTRAVENOUS | Status: AC
  Administered 2012-04-30: 10 ug/min via INTRAVENOUS
  Filled 2012-04-29: qty 2

## 2012-04-29 MED ORDER — EPINEPHRINE HCL 1 MG/ML IJ SOLN
0.5000 ug/min | INTRAVENOUS | Status: DC
  Filled 2012-04-29: qty 4

## 2012-04-29 MED ORDER — DOPAMINE-DEXTROSE 3.2-5 MG/ML-% IV SOLN
2.0000 ug/kg/min | INTRAVENOUS | Status: AC
  Administered 2012-04-30: 3 ug/kg/min via INTRAVENOUS
  Filled 2012-04-29: qty 250

## 2012-04-29 MED ORDER — METOPROLOL TARTRATE 12.5 MG HALF TABLET: 12.5000 mg | ORAL_TABLET | Freq: Once | ORAL | Status: DC

## 2012-04-30 ENCOUNTER — Encounter (HOSPITAL_COMMUNITY)
Admission: RE | Disposition: A | Discharge status: Home / Self Care | Payer: Self-pay | Source: Ambulatory Visit | Attending: Thoracic Surgery (Cardiothoracic Vascular Surgery)

## 2012-04-30 ENCOUNTER — Encounter (HOSPITAL_COMMUNITY): Payer: Self-pay | Admitting: *Deleted

## 2012-04-30 ENCOUNTER — Inpatient Hospital Stay (HOSPITAL_COMMUNITY)
Admission: RE | Admit: 2012-04-30 | Discharge: 2012-05-07 | DRG: 220 | Disposition: A | Discharge status: Home / Self Care | Payer: Medicare Other | Source: Ambulatory Visit | Attending: Thoracic Surgery (Cardiothoracic Vascular Surgery) | Admitting: Thoracic Surgery (Cardiothoracic Vascular Surgery)

## 2012-04-30 ENCOUNTER — Encounter (HOSPITAL_COMMUNITY): Payer: Self-pay | Admitting: Certified Registered"

## 2012-04-30 ENCOUNTER — Ambulatory Visit (HOSPITAL_COMMUNITY): Payer: Medicare Other | Admitting: Certified Registered"

## 2012-04-30 ENCOUNTER — Encounter (HOSPITAL_COMMUNITY): Payer: Self-pay | Admitting: Thoracic Surgery (Cardiothoracic Vascular Surgery)

## 2012-04-30 ENCOUNTER — Inpatient Hospital Stay (HOSPITAL_COMMUNITY): Payer: Medicare Other

## 2012-04-30 DIAGNOSIS — I428 Other cardiomyopathies: Secondary | ICD-10-CM | POA: Diagnosis present

## 2012-04-30 DIAGNOSIS — J9819 Other pulmonary collapse: Secondary | ICD-10-CM | POA: Diagnosis not present

## 2012-04-30 DIAGNOSIS — I2510 Atherosclerotic heart disease of native coronary artery without angina pectoris: Secondary | ICD-10-CM

## 2012-04-30 DIAGNOSIS — M129 Arthropathy, unspecified: Secondary | ICD-10-CM | POA: Diagnosis present

## 2012-04-30 DIAGNOSIS — Z9889 Other specified postprocedural states: Secondary | ICD-10-CM

## 2012-04-30 DIAGNOSIS — R000 Tachycardia, unspecified: Secondary | ICD-10-CM | POA: Diagnosis not present

## 2012-04-30 DIAGNOSIS — Z8679 Personal history of other diseases of the circulatory system: Secondary | ICD-10-CM

## 2012-04-30 DIAGNOSIS — Z8249 Family history of ischemic heart disease and other diseases of the circulatory system: Secondary | ICD-10-CM

## 2012-04-30 DIAGNOSIS — I509 Heart failure, unspecified: Secondary | ICD-10-CM | POA: Diagnosis present

## 2012-04-30 DIAGNOSIS — I5022 Chronic systolic (congestive) heart failure: Secondary | ICD-10-CM | POA: Diagnosis present

## 2012-04-30 DIAGNOSIS — I4891 Unspecified atrial fibrillation: Secondary | ICD-10-CM

## 2012-04-30 DIAGNOSIS — E119 Type 2 diabetes mellitus without complications: Secondary | ICD-10-CM | POA: Diagnosis present

## 2012-04-30 DIAGNOSIS — I359 Nonrheumatic aortic valve disorder, unspecified: Principal | ICD-10-CM | POA: Diagnosis present

## 2012-04-30 DIAGNOSIS — D62 Acute posthemorrhagic anemia: Secondary | ICD-10-CM | POA: Diagnosis not present

## 2012-04-30 DIAGNOSIS — Z85828 Personal history of other malignant neoplasm of skin: Secondary | ICD-10-CM

## 2012-04-30 DIAGNOSIS — I10 Essential (primary) hypertension: Secondary | ICD-10-CM | POA: Diagnosis present

## 2012-04-30 DIAGNOSIS — Z87891 Personal history of nicotine dependence: Secondary | ICD-10-CM

## 2012-04-30 DIAGNOSIS — Z951 Presence of aortocoronary bypass graft: Secondary | ICD-10-CM

## 2012-04-30 DIAGNOSIS — I350 Nonrheumatic aortic (valve) stenosis: Secondary | ICD-10-CM

## 2012-04-30 DIAGNOSIS — D689 Coagulation defect, unspecified: Secondary | ICD-10-CM | POA: Diagnosis not present

## 2012-04-30 DIAGNOSIS — Z953 Presence of xenogenic heart valve: Secondary | ICD-10-CM

## 2012-04-30 HISTORY — DX: Other specified postprocedural states: Z98.890

## 2012-04-30 HISTORY — DX: Gastro-esophageal reflux disease without esophagitis: K21.9

## 2012-04-30 HISTORY — DX: Anxiety disorder, unspecified: F41.9

## 2012-04-30 HISTORY — DX: Myoneural disorder, unspecified: G70.9

## 2012-04-30 HISTORY — DX: Personal history of other diseases of the circulatory system: Z86.79

## 2012-04-30 HISTORY — DX: Personal history of other diseases of the circulatory system: Z98.890

## 2012-04-30 HISTORY — PX: CORONARY ARTERY BYPASS GRAFT: SHX141

## 2012-04-30 HISTORY — DX: Presence of aortocoronary bypass graft: Z95.1

## 2012-04-30 HISTORY — DX: Cardiac murmur, unspecified: R01.1

## 2012-04-30 HISTORY — DX: Malignant (primary) neoplasm, unspecified: C80.1

## 2012-04-30 HISTORY — DX: Presence of xenogenic heart valve: Z95.3

## 2012-04-30 HISTORY — PX: AORTIC VALVE REPLACEMENT: SHX41

## 2012-04-30 HISTORY — PX: MAZE: SHX5063

## 2012-04-30 LAB — POCT I-STAT 3, ART BLOOD GAS (G3+)
Acid-Base Excess: 2 mmol/L (ref 0.0–2.0)
Acid-base deficit: 1 mmol/L (ref 0.0–2.0)
Bicarbonate: 24.5 mEq/L — ABNORMAL HIGH (ref 20.0–24.0)
O2 Saturation: 100 %
O2 Saturation: 100 %
O2 Saturation: 100 %
O2 Saturation: 96 %
TCO2: 28 mmol/L (ref 0–100)
TCO2: 26 mmol/L (ref 0–100)
TCO2: 27 mmol/L (ref 0–100)
pCO2 arterial: 44.7 mmHg (ref 35.0–45.0)
pCO2 arterial: 33 mmHg — ABNORMAL LOW (ref 35.0–45.0)
pCO2 arterial: 45.5 mmHg — ABNORMAL HIGH (ref 35.0–45.0)
pH, Arterial: 7.354 (ref 7.350–7.450)

## 2012-04-30 LAB — CBC
HCT: 27.8 % — ABNORMAL LOW (ref 39.0–52.0)
HCT: 22.8 % — ABNORMAL LOW (ref 39.0–52.0)
Hemoglobin: 9.2 g/dL — ABNORMAL LOW (ref 13.0–17.0)
MCH: 31.3 pg (ref 26.0–34.0)
MCH: 31.8 pg (ref 26.0–34.0)
MCHC: 34.2 g/dL (ref 30.0–36.0)
MCV: 94.6 fL (ref 78.0–100.0)
MCV: 93.1 fL (ref 78.0–100.0)
Platelets: 120 10*3/uL — ABNORMAL LOW (ref 150–400)
RBC: 2.94 MIL/uL — ABNORMAL LOW (ref 4.22–5.81)
RDW: 14.8 % (ref 11.5–15.5)
WBC: 20.3 10*3/uL — ABNORMAL HIGH (ref 4.0–10.5)

## 2012-04-30 LAB — GLUCOSE, CAPILLARY: Glucose-Capillary: 155 mg/dL — ABNORMAL HIGH (ref 70–99)

## 2012-04-30 LAB — POCT I-STAT 4, (NA,K, GLUC, HGB,HCT)
Glucose, Bld: 142 mg/dL — ABNORMAL HIGH (ref 70–99)
Glucose, Bld: 169 mg/dL — ABNORMAL HIGH (ref 70–99)
Glucose, Bld: 129 mg/dL — ABNORMAL HIGH (ref 70–99)
HCT: 29 % — ABNORMAL LOW (ref 39.0–52.0)
HCT: 24 % — ABNORMAL LOW (ref 39.0–52.0)
HCT: 30 % — ABNORMAL LOW (ref 39.0–52.0)
Hemoglobin: 9.9 g/dL — ABNORMAL LOW (ref 13.0–17.0)
Hemoglobin: 8.5 g/dL — ABNORMAL LOW (ref 13.0–17.0)
Potassium: 4.7 mEq/L (ref 3.5–5.1)
Potassium: 6 mEq/L — ABNORMAL HIGH (ref 3.5–5.1)
Potassium: 4.5 mEq/L (ref 3.5–5.1)
Potassium: 4.3 mEq/L (ref 3.5–5.1)
Sodium: 141 mEq/L (ref 135–145)
Sodium: 138 mEq/L (ref 135–145)
Sodium: 140 mEq/L (ref 135–145)
Sodium: 139 mEq/L (ref 135–145)
Sodium: 140 mEq/L (ref 135–145)
Sodium: 141 mEq/L (ref 135–145)

## 2012-04-30 LAB — POCT I-STAT, CHEM 8
BUN: 13 mg/dL (ref 6–23)
Creatinine, Ser: 0.9 mg/dL (ref 0.50–1.35)
Glucose, Bld: 124 mg/dL — ABNORMAL HIGH (ref 70–99)
Hemoglobin: 7.1 g/dL — ABNORMAL LOW (ref 13.0–17.0)
Potassium: 3.9 mEq/L (ref 3.5–5.1)
Sodium: 143 mEq/L (ref 135–145)
TCO2: 24 mmol/L (ref 0–100)

## 2012-04-30 LAB — APTT: aPTT: 39 seconds — ABNORMAL HIGH (ref 24–37)

## 2012-04-30 LAB — HEMOGLOBIN AND HEMATOCRIT, BLOOD
HCT: 25.2 % — ABNORMAL LOW (ref 39.0–52.0)
Hemoglobin: 8.4 g/dL — ABNORMAL LOW (ref 13.0–17.0)

## 2012-04-30 LAB — PROTIME-INR: INR: 1.34 (ref 0.00–1.49)

## 2012-04-30 LAB — PLATELET COUNT: Platelets: 148 10*3/uL — ABNORMAL LOW (ref 150–400)

## 2012-04-30 LAB — PREPARE RBC (CROSSMATCH)

## 2012-04-30 SURGERY — CORONARY ARTERY BYPASS GRAFTING (CABG)
Anesthesia: General | Site: Chest | Wound class: Clean

## 2012-04-30 MED ORDER — SODIUM CHLORIDE 0.9 % IV SOLN
INTRAVENOUS | Status: DC | PRN
  Administered 2012-04-30: 15:00:00 via INTRAVENOUS

## 2012-04-30 MED ORDER — SODIUM CHLORIDE 0.9 % IJ SOLN
3.0000 mL | Freq: Two times a day (BID) | INTRAMUSCULAR | Status: DC
  Administered 2012-05-01 – 2012-05-03 (×6): 3 mL via INTRAVENOUS

## 2012-04-30 MED ORDER — DEXTROSE 5 % IV SOLN
1.5000 g | Freq: Two times a day (BID) | INTRAVENOUS | Status: AC
  Administered 2012-04-30 – 2012-05-02 (×4): 1.5 g via INTRAVENOUS
  Filled 2012-04-30 (×4): qty 1.5

## 2012-04-30 MED ORDER — SODIUM CHLORIDE 0.9 % IV SOLN: 250.0000 mL | INTRAVENOUS | Status: DC

## 2012-04-30 MED ORDER — MIDAZOLAM HCL 2 MG/2ML IJ SOLN
2.0000 mg | INTRAMUSCULAR | Status: DC | PRN
  Administered 2012-05-01: 2 mg via INTRAVENOUS
  Filled 2012-04-30 (×2): qty 2

## 2012-04-30 MED ORDER — FAMOTIDINE IN NACL 20-0.9 MG/50ML-% IV SOLN
20.0000 mg | Freq: Two times a day (BID) | INTRAVENOUS | Status: AC
  Administered 2012-04-30 – 2012-05-01 (×2): 20 mg via INTRAVENOUS
  Filled 2012-04-30: qty 50

## 2012-04-30 MED ORDER — CALCIUM CHLORIDE 10 % IV SOLN
1.0000 g | Freq: Once | INTRAVENOUS | Status: AC | PRN
  Filled 2012-04-30: qty 10

## 2012-04-30 MED ORDER — FENTANYL CITRATE 0.05 MG/ML IJ SOLN
INTRAMUSCULAR | Status: DC | PRN
  Administered 2012-04-30 (×5): 250 ug via INTRAVENOUS
  Administered 2012-04-30: 200 ug via INTRAVENOUS
  Administered 2012-04-30: 250 ug via INTRAVENOUS
  Administered 2012-04-30: 50 ug via INTRAVENOUS

## 2012-04-30 MED ORDER — PROTAMINE SULFATE 10 MG/ML IV SOLN
INTRAVENOUS | Status: DC | PRN
  Administered 2012-04-30 (×7): 50 mg via INTRAVENOUS

## 2012-04-30 MED ORDER — METOPROLOL TARTRATE 1 MG/ML IV SOLN: 2.5000 mg | INTRAVENOUS | Status: DC | PRN

## 2012-04-30 MED ORDER — SODIUM CHLORIDE 0.45 % IV SOLN
INTRAVENOUS | Status: DC
  Administered 2012-04-30: 20 mL/h via INTRAVENOUS

## 2012-04-30 MED ORDER — SODIUM CHLORIDE 0.9 % IJ SOLN: 3.0000 mL | INTRAMUSCULAR | Status: DC | PRN

## 2012-04-30 MED ORDER — INSULIN REGULAR BOLUS VIA INFUSION
0.0000 [IU] | Freq: Three times a day (TID) | INTRAVENOUS | Status: DC
  Administered 2012-04-30: 6.2 [IU] via INTRAVENOUS
  Administered 2012-05-01: 3 [IU] via INTRAVENOUS
  Filled 2012-04-30: qty 10

## 2012-04-30 MED ORDER — DOPAMINE-DEXTROSE 3.2-5 MG/ML-% IV SOLN
0.0000 ug/kg/min | INTRAVENOUS | Status: DC
  Administered 2012-05-02: 3 ug/kg/min via INTRAVENOUS
  Filled 2012-04-30: qty 250

## 2012-04-30 MED ORDER — DOCUSATE SODIUM 100 MG PO CAPS
200.0000 mg | ORAL_CAPSULE | Freq: Every day | ORAL | Status: DC
  Administered 2012-05-01 – 2012-05-07 (×6): 200 mg via ORAL
  Filled 2012-04-30 (×7): qty 2

## 2012-04-30 MED ORDER — INSULIN REGULAR HUMAN 100 UNIT/ML IJ SOLN
INTRAMUSCULAR | Status: DC
  Administered 2012-04-30: 6.3 [IU]/h via INTRAVENOUS
  Administered 2012-04-30: 5.8 [IU]/h via INTRAVENOUS
  Administered 2012-04-30: 6.5 [IU]/h via INTRAVENOUS
  Administered 2012-04-30: 5.7 [IU]/h via INTRAVENOUS
  Administered 2012-04-30: 6.3 [IU]/h via INTRAVENOUS
  Administered 2012-04-30: 5.4 [IU]/h via INTRAVENOUS
  Administered 2012-05-02: 3.4 [IU]/h via INTRAVENOUS
  Filled 2012-04-30 (×2): qty 1

## 2012-04-30 MED ORDER — ACETAMINOPHEN 160 MG/5ML PO SOLN
975.0000 mg | Freq: Four times a day (QID) | ORAL | Status: DC
  Administered 2012-04-30 – 2012-05-01 (×2): 975 mg
  Filled 2012-04-30 (×2): qty 40.6

## 2012-04-30 MED ORDER — MORPHINE SULFATE 2 MG/ML IJ SOLN
2.0000 mg | INTRAMUSCULAR | Status: DC | PRN
  Administered 2012-04-30 – 2012-05-01 (×2): 2 mg via INTRAVENOUS
  Filled 2012-04-30 (×2): qty 1

## 2012-04-30 MED ORDER — HEPARIN SODIUM (PORCINE) 1000 UNIT/ML IJ SOLN
INTRAMUSCULAR | Status: DC | PRN
  Administered 2012-04-30: 2000 [IU] via INTRAVENOUS
  Administered 2012-04-30: 38000 [IU] via INTRAVENOUS

## 2012-04-30 MED ORDER — LACTATED RINGERS IV SOLN
INTRAVENOUS | Status: DC | PRN
  Administered 2012-04-30 (×2): via INTRAVENOUS

## 2012-04-30 MED ORDER — SODIUM CHLORIDE 0.9 % IR SOLN
Status: DC | PRN
  Administered 2012-04-30: 7000 mL
  Administered 2012-04-30: 1000 mL

## 2012-04-30 MED ORDER — METOPROLOL TARTRATE 25 MG/10 ML ORAL SUSPENSION
12.5000 mg | Freq: Two times a day (BID) | ORAL | Status: DC
  Filled 2012-04-30 (×3): qty 5

## 2012-04-30 MED ORDER — SODIUM CHLORIDE 0.9 % IR SOLN
Status: DC | PRN
  Administered 2012-04-30: 3000 mL

## 2012-04-30 MED ORDER — CHLORHEXIDINE GLUCONATE 4 % EX LIQD: 30.0000 mL | CUTANEOUS | Status: DC

## 2012-04-30 MED ORDER — BISACODYL 5 MG PO TBEC
10.0000 mg | DELAYED_RELEASE_TABLET | Freq: Every day | ORAL | Status: DC
  Administered 2012-05-01 – 2012-05-07 (×6): 10 mg via ORAL
  Filled 2012-04-30 (×6): qty 2

## 2012-04-30 MED ORDER — BISACODYL 10 MG RE SUPP: 10.0000 mg | Freq: Every day | RECTAL | Status: DC

## 2012-04-30 MED ORDER — VECURONIUM BROMIDE 10 MG IV SOLR
INTRAVENOUS | Status: DC | PRN
  Administered 2012-04-30 (×6): 5 mg via INTRAVENOUS

## 2012-04-30 MED ORDER — ONDANSETRON HCL 4 MG/2ML IJ SOLN
4.0000 mg | Freq: Four times a day (QID) | INTRAMUSCULAR | Status: DC | PRN
  Administered 2012-05-01 – 2012-05-05 (×5): 4 mg via INTRAVENOUS
  Filled 2012-04-30 (×5): qty 2

## 2012-04-30 MED ORDER — LACTATED RINGERS IV SOLN
INTRAVENOUS | Status: DC | PRN
  Administered 2012-04-30 (×3): via INTRAVENOUS

## 2012-04-30 MED ORDER — ACETAMINOPHEN 160 MG/5ML PO SOLN
650.0000 mg | ORAL | Status: AC
  Administered 2012-04-30: 650 mg

## 2012-04-30 MED ORDER — MILRINONE IN DEXTROSE 200-5 MCG/ML-% IV SOLN
0.3000 ug/kg/min | INTRAVENOUS | Status: DC
Start: 2012-04-30 — End: 2012-05-02
  Administered 2012-04-30 – 2012-05-01 (×2): 0.3 ug/kg/min via INTRAVENOUS
  Filled 2012-04-30 (×3): qty 100

## 2012-04-30 MED ORDER — MORPHINE SULFATE 2 MG/ML IJ SOLN: 1.0000 mg | INTRAMUSCULAR | Status: AC | PRN

## 2012-04-30 MED ORDER — PHENYLEPHRINE HCL 10 MG/ML IJ SOLN
0.0000 ug/min | INTRAVENOUS | Status: DC
  Administered 2012-05-01: 20 ug/min via INTRAVENOUS
  Filled 2012-04-30: qty 2

## 2012-04-30 MED ORDER — PROPOFOL 10 MG/ML IV BOLUS
INTRAVENOUS | Status: DC | PRN
  Administered 2012-04-30: 200 mg via INTRAVENOUS

## 2012-04-30 MED ORDER — METOPROLOL TARTRATE 12.5 MG HALF TABLET
12.5000 mg | ORAL_TABLET | Freq: Two times a day (BID) | ORAL | Status: DC
  Administered 2012-05-01: 12.5 mg via ORAL
  Filled 2012-04-30 (×5): qty 1

## 2012-04-30 MED ORDER — MAGNESIUM SULFATE 40 MG/ML IJ SOLN
4.0000 g | Freq: Once | INTRAMUSCULAR | Status: AC
  Administered 2012-04-30: 4 g via INTRAVENOUS
  Filled 2012-04-30: qty 100

## 2012-04-30 MED ORDER — DEXMEDETOMIDINE HCL IN NACL 200 MCG/50ML IV SOLN
0.1000 ug/kg/h | INTRAVENOUS | Status: DC
  Administered 2012-04-30 – 2012-05-01 (×4): 0.7 ug/kg/h via INTRAVENOUS
  Filled 2012-04-30 (×7): qty 50

## 2012-04-30 MED ORDER — ALBUMIN HUMAN 5 % IV SOLN
INTRAVENOUS | Status: DC | PRN
  Administered 2012-04-30 (×3): via INTRAVENOUS

## 2012-04-30 MED ORDER — LACTATED RINGERS IV SOLN
INTRAVENOUS | Status: DC
  Administered 2012-04-30 – 2012-05-02 (×2): 20 mL/h via INTRAVENOUS

## 2012-04-30 MED ORDER — VANCOMYCIN HCL IN DEXTROSE 1-5 GM/200ML-% IV SOLN
1000.0000 mg | Freq: Once | INTRAVENOUS | Status: AC
  Administered 2012-05-01: 1000 mg via INTRAVENOUS
  Filled 2012-04-30 (×2): qty 200

## 2012-04-30 MED ORDER — ROCURONIUM BROMIDE 100 MG/10ML IV SOLN
INTRAVENOUS | Status: DC | PRN
  Administered 2012-04-30: 50 mg via INTRAVENOUS

## 2012-04-30 MED ORDER — SODIUM CHLORIDE 0.9 % IV SOLN
INTRAVENOUS | Status: DC
  Administered 2012-04-30 – 2012-05-04 (×2): 20 mL/h via INTRAVENOUS

## 2012-04-30 MED ORDER — SODIUM CHLORIDE 0.9 % IJ SOLN
OROMUCOSAL | Status: DC | PRN
  Administered 2012-04-30: 10:00:00 via TOPICAL

## 2012-04-30 MED ORDER — POTASSIUM CHLORIDE 10 MEQ/50ML IV SOLN: 10.0000 meq | INTRAVENOUS | Status: AC

## 2012-04-30 MED ORDER — MILRINONE IN DEXTROSE 200-5 MCG/ML-% IV SOLN
0.1250 ug/kg/min | INTRAVENOUS | Status: DC
  Administered 2012-04-30: .3 ug/kg/min via INTRAVENOUS
  Filled 2012-04-30: qty 100

## 2012-04-30 MED ORDER — ASPIRIN EC 325 MG PO TBEC
325.0000 mg | DELAYED_RELEASE_TABLET | Freq: Every day | ORAL | Status: DC
  Administered 2012-05-01: 325 mg via ORAL
  Filled 2012-04-30 (×2): qty 1

## 2012-04-30 MED ORDER — MIDAZOLAM HCL 5 MG/5ML IJ SOLN
INTRAMUSCULAR | Status: DC | PRN
  Administered 2012-04-30 (×3): 2 mg via INTRAVENOUS
  Administered 2012-04-30: 3 mg via INTRAVENOUS
  Administered 2012-04-30: 2 mg via INTRAVENOUS
  Administered 2012-04-30: 1 mg via INTRAVENOUS
  Administered 2012-04-30 (×2): 2 mg via INTRAVENOUS
  Administered 2012-04-30: 1 mg via INTRAVENOUS
  Administered 2012-04-30: 3 mg via INTRAVENOUS

## 2012-04-30 MED ORDER — ACETAMINOPHEN 500 MG PO TABS
1000.0000 mg | ORAL_TABLET | Freq: Four times a day (QID) | ORAL | Status: AC
  Administered 2012-05-01 – 2012-05-05 (×16): 1000 mg via ORAL
  Filled 2012-04-30 (×18): qty 2

## 2012-04-30 MED ORDER — ACETAMINOPHEN 650 MG RE SUPP: 650.0000 mg | RECTAL | Status: AC

## 2012-04-30 MED ORDER — PANTOPRAZOLE SODIUM 40 MG PO TBEC
40.0000 mg | DELAYED_RELEASE_TABLET | Freq: Every day | ORAL | Status: DC
  Administered 2012-05-02 – 2012-05-06 (×5): 40 mg via ORAL
  Filled 2012-04-30 (×6): qty 1

## 2012-04-30 MED ORDER — OXYCODONE HCL 5 MG PO TABS
5.0000 mg | ORAL_TABLET | ORAL | Status: DC | PRN
  Administered 2012-05-01: 5 mg via ORAL
  Administered 2012-05-01 – 2012-05-02 (×3): 10 mg via ORAL
  Administered 2012-05-02: 5 mg via ORAL
  Filled 2012-04-30: qty 1
  Filled 2012-04-30: qty 2
  Filled 2012-04-30: qty 1
  Filled 2012-04-30 (×2): qty 2

## 2012-04-30 MED ORDER — NITROGLYCERIN IN D5W 200-5 MCG/ML-% IV SOLN: 0.0000 ug/min | INTRAVENOUS | Status: DC

## 2012-04-30 MED ORDER — ASPIRIN 81 MG PO CHEW: 324.0000 mg | CHEWABLE_TABLET | Freq: Every day | ORAL | Status: DC

## 2012-04-30 MED ORDER — ALBUMIN HUMAN 5 % IV SOLN
250.0000 mL | INTRAVENOUS | Status: AC | PRN
  Administered 2012-04-30 (×2): 250 mL via INTRAVENOUS

## 2012-04-30 SURGICAL SUPPLY — 162 items
ADAPTER CARDIO PERF ANTE/RETRO (ADAPTER) ×4 IMPLANT
APPLIER CLIP 9.375 MED OPEN (MISCELLANEOUS)
APPLIER CLIP 9.375 SM OPEN (CLIP)
ATTRACTOMAT 16X20 MAGNETIC DRP (DRAPES) ×4 IMPLANT
BAG DECANTER FOR FLEXI CONT (MISCELLANEOUS) ×4 IMPLANT
BANDAGE ELASTIC 4 VELCRO ST LF (GAUZE/BANDAGES/DRESSINGS) ×2 IMPLANT
BANDAGE ELASTIC 6 VELCRO ST LF (GAUZE/BANDAGES/DRESSINGS) ×2 IMPLANT
BANDAGE GAUZE ELAST BULKY 4 IN (GAUZE/BANDAGES/DRESSINGS) ×2 IMPLANT
BASKET HEART (ORDER IN 25'S) (MISCELLANEOUS) ×1
BASKET HEART (ORDER IN 25S) (MISCELLANEOUS) ×1 IMPLANT
BENZOIN TINCTURE PRP APPL 2/3 (GAUZE/BANDAGES/DRESSINGS) ×2 IMPLANT
BLADE STERNUM SYSTEM 6 (BLADE) ×4 IMPLANT
BLADE SURG 11 STRL SS (BLADE) ×6 IMPLANT
BLADE SURG ROTATE 9660 (MISCELLANEOUS) IMPLANT
CANISTER SUCTION 2500CC (MISCELLANEOUS) ×4 IMPLANT
CANN PRFSN 3/8X14X24FR PCFC (MISCELLANEOUS)
CANN PRFSN 3/8XCNCT ST RT ANG (MISCELLANEOUS)
CANNULA FEM VENOUS REMOTE 22FR (CANNULA) ×2 IMPLANT
CANNULA GUNDRY RCSP 15FR (MISCELLANEOUS) ×4 IMPLANT
CANNULA PRFSN 3/8X14X24FR PCFC (MISCELLANEOUS) IMPLANT
CANNULA PRFSN 3/8XCNCT RT ANG (MISCELLANEOUS) IMPLANT
CANNULA SOFTFLOW AORTIC 7M21FR (CANNULA) ×2 IMPLANT
CANNULA VEN MTL TIP RT (MISCELLANEOUS)
CANNULA VENNOUS METAL TIP 20FR (CANNULA) ×2 IMPLANT
CARDIOBLATE CARDIAC ABLATION (MISCELLANEOUS)
CATH CPB KIT OWEN (MISCELLANEOUS) ×2 IMPLANT
CATH HEART VENT LEFT (CATHETERS) ×1 IMPLANT
CATH THORACIC 28FR (CATHETERS) IMPLANT
CATH THORACIC 28FR RT ANG (CATHETERS) IMPLANT
CATH THORACIC 36FR (CATHETERS) ×2 IMPLANT
CATH THORACIC 36FR RT ANG (CATHETERS) ×2 IMPLANT
CLAMP ISOLATOR SYNERGY LG (MISCELLANEOUS) ×2 IMPLANT
CLIP APPLIE 9.375 MED OPEN (MISCELLANEOUS) IMPLANT
CLIP APPLIE 9.375 SM OPEN (CLIP) IMPLANT
CLIP FOGARTY SPRING 6M (CLIP) IMPLANT
CLIP RETRACTION 3.0MM CORONARY (MISCELLANEOUS) ×2 IMPLANT
CLIP TI MEDIUM 24 (CLIP) IMPLANT
CLIP TI WIDE RED SMALL 24 (CLIP) IMPLANT
CLOTH BEACON ORANGE TIMEOUT ST (SAFETY) ×4 IMPLANT
CONN 1/2X1/2X1/2  BEN (MISCELLANEOUS) ×2
CONN 1/2X1/2X1/2 BEN (MISCELLANEOUS) ×2 IMPLANT
CONN 3/8X1/2 ST GISH (MISCELLANEOUS) ×6 IMPLANT
CONN ST 1/4X3/8  BEN (MISCELLANEOUS) ×2
CONN ST 1/4X3/8 BEN (MISCELLANEOUS) ×2 IMPLANT
CONN Y 3/8X3/8X3/8  BEN (MISCELLANEOUS)
CONN Y 3/8X3/8X3/8 BEN (MISCELLANEOUS) IMPLANT
CONT SPEC 4OZ CLIKSEAL STRL BL (MISCELLANEOUS) ×2 IMPLANT
COVER MAYO STAND STRL (DRAPES) ×2 IMPLANT
COVER SURGICAL LIGHT HANDLE (MISCELLANEOUS) ×4 IMPLANT
CRADLE DONUT ADULT HEAD (MISCELLANEOUS) ×4 IMPLANT
DERMABOND ADVANCED (GAUZE/BANDAGES/DRESSINGS) ×1
DERMABOND ADVANCED .7 DNX12 (GAUZE/BANDAGES/DRESSINGS) ×1 IMPLANT
DEVICE CARDIOBLATE CARDIAC ABL (MISCELLANEOUS) IMPLANT
DRAIN CHANNEL 28F RND 3/8 FF (WOUND CARE) ×4 IMPLANT
DRAIN CHANNEL 32F RND 10.7 FF (WOUND CARE) ×4 IMPLANT
DRAPE CARDIOVASCULAR INCISE (DRAPES) ×1
DRAPE INCISE IOBAN 66X45 STRL (DRAPES) ×2 IMPLANT
DRAPE SLUSH/WARMER DISC (DRAPES) ×4 IMPLANT
DRAPE SRG 135X102X78XABS (DRAPES) ×1 IMPLANT
DRSG COVADERM 4X14 (GAUZE/BANDAGES/DRESSINGS) ×2 IMPLANT
ELECT BLADE 4.0 EZ CLEAN MEGAD (MISCELLANEOUS) ×2
ELECT REM PT RETURN 9FT ADLT (ELECTROSURGICAL) ×8
ELECTRODE BLDE 4.0 EZ CLN MEGD (MISCELLANEOUS) ×1 IMPLANT
ELECTRODE REM PT RTRN 9FT ADLT (ELECTROSURGICAL) ×4 IMPLANT
GLOVE BIO SURGEON STRL SZ 6 (GLOVE) IMPLANT
GLOVE BIO SURGEON STRL SZ 6.5 (GLOVE) ×4 IMPLANT
GLOVE BIO SURGEON STRL SZ7 (GLOVE) IMPLANT
GLOVE BIO SURGEON STRL SZ7.5 (GLOVE) IMPLANT
GLOVE BIOGEL PI IND STRL 6 (GLOVE) IMPLANT
GLOVE BIOGEL PI IND STRL 6.5 (GLOVE) IMPLANT
GLOVE BIOGEL PI IND STRL 7.0 (GLOVE) ×3 IMPLANT
GLOVE BIOGEL PI INDICATOR 6 (GLOVE)
GLOVE BIOGEL PI INDICATOR 6.5 (GLOVE)
GLOVE BIOGEL PI INDICATOR 7.0 (GLOVE) ×3
GLOVE EUDERMIC 7 POWDERFREE (GLOVE) IMPLANT
GLOVE ORTHO TXT STRL SZ7.5 (GLOVE) ×6 IMPLANT
GOWN STRL NON-REIN LRG LVL3 (GOWN DISPOSABLE) ×16 IMPLANT
HEMOSTAT POWDER SURGIFOAM 1G (HEMOSTASIS) ×12 IMPLANT
INSERT FOGARTY 61MM (MISCELLANEOUS) IMPLANT
INSERT FOGARTY XLG (MISCELLANEOUS) ×4 IMPLANT
KIT BASIN OR (CUSTOM PROCEDURE TRAY) ×4 IMPLANT
KIT DILATOR VASC 18G NDL (KITS) ×2 IMPLANT
KIT DRAINAGE VACCUM ASSIST (KITS) ×2 IMPLANT
KIT ROOM TURNOVER OR (KITS) ×4 IMPLANT
KIT SUCTION CATH 14FR (SUCTIONS) ×16 IMPLANT
KIT VASOVIEW W/TROCAR VH 2000 (KITS) ×2 IMPLANT
LEAD PACING MYOCARDI (MISCELLANEOUS) ×2 IMPLANT
LINE VENT (MISCELLANEOUS) ×2 IMPLANT
LOOP VESSEL SUPERMAXI WHITE (MISCELLANEOUS) ×2 IMPLANT
MARKER GRAFT CORONARY BYPASS (MISCELLANEOUS) ×6 IMPLANT
NS IRRIG 1000ML POUR BTL (IV SOLUTION) ×20 IMPLANT
PACK OPEN HEART (CUSTOM PROCEDURE TRAY) ×4 IMPLANT
PAD ARMBOARD 7.5X6 YLW CONV (MISCELLANEOUS) ×8 IMPLANT
PENCIL BUTTON HOLSTER BLD 10FT (ELECTRODE) ×2 IMPLANT
PROBE CRYO2-ABLATION MALLABLE (MISCELLANEOUS) ×2 IMPLANT
PUNCH AORTIC ROTATE 4.0MM (MISCELLANEOUS) ×2 IMPLANT
PUNCH AORTIC ROTATE 4.5MM 8IN (MISCELLANEOUS) IMPLANT
PUNCH AORTIC ROTATE 5MM 8IN (MISCELLANEOUS) IMPLANT
SET CARDIOPLEGIA MPS 5001102 (MISCELLANEOUS) ×2 IMPLANT
SET IRRIG TUBING LAPAROSCOPIC (IRRIGATION / IRRIGATOR) ×4 IMPLANT
SOLUTION ANTI FOG 6CC (MISCELLANEOUS) ×2 IMPLANT
SPONGE GAUZE 4X4 12PLY (GAUZE/BANDAGES/DRESSINGS) ×6 IMPLANT
SPONGE LAP 18X18 X RAY DECT (DISPOSABLE) ×4 IMPLANT
SPONGE LAP 4X18 X RAY DECT (DISPOSABLE) ×2 IMPLANT
SUCKER INTRACARDIAC WEIGHTED (SUCKER) ×4 IMPLANT
SUT BONE WAX W31G (SUTURE) ×4 IMPLANT
SUT ETHIBON 2 0 V 52N 30 (SUTURE) ×10 IMPLANT
SUT ETHIBON EXCEL 2-0 V-5 (SUTURE) IMPLANT
SUT ETHIBOND 2 0 SH (SUTURE) ×2
SUT ETHIBOND 2 0 SH 36X2 (SUTURE) ×2 IMPLANT
SUT ETHIBOND 2 0 V4 (SUTURE) IMPLANT
SUT ETHIBOND 2 0V4 GREEN (SUTURE) IMPLANT
SUT ETHIBOND 4 0 RB 1 (SUTURE) IMPLANT
SUT ETHIBOND V-5 VALVE (SUTURE) IMPLANT
SUT ETHIBOND X763 2 0 SH 1 (SUTURE) ×12 IMPLANT
SUT MNCRL AB 3-0 PS2 18 (SUTURE) ×8 IMPLANT
SUT MNCRL AB 4-0 PS2 18 (SUTURE) ×2 IMPLANT
SUT PDS AB 1 CTX 36 (SUTURE) ×8 IMPLANT
SUT PROLENE 2 0 SH DA (SUTURE) IMPLANT
SUT PROLENE 3 0 SH 1 (SUTURE) ×2 IMPLANT
SUT PROLENE 3 0 SH DA (SUTURE) ×4 IMPLANT
SUT PROLENE 3 0 SH1 36 (SUTURE) ×6 IMPLANT
SUT PROLENE 4 0 RB 1 (SUTURE) ×6
SUT PROLENE 4 0 SH DA (SUTURE) ×8 IMPLANT
SUT PROLENE 4-0 RB1 .5 CRCL 36 (SUTURE) ×6 IMPLANT
SUT PROLENE 5 0 C 1 36 (SUTURE) IMPLANT
SUT PROLENE 6 0 C 1 30 (SUTURE) ×6 IMPLANT
SUT PROLENE 7.0 RB 3 (SUTURE) ×6 IMPLANT
SUT PROLENE 8 0 BV175 6 (SUTURE) ×2 IMPLANT
SUT PROLENE BLUE 7 0 (SUTURE) ×6 IMPLANT
SUT PROLENE POLY MONO (SUTURE) IMPLANT
SUT SILK  1 MH (SUTURE) ×3
SUT SILK 1 MH (SUTURE) ×3 IMPLANT
SUT SILK 2 0 SH CR/8 (SUTURE) IMPLANT
SUT SILK 3 0 SH CR/8 (SUTURE) IMPLANT
SUT STEEL 6MS V (SUTURE) IMPLANT
SUT STEEL STERNAL CCS#1 18IN (SUTURE) IMPLANT
SUT STEEL SZ 6 DBL 3X14 BALL (SUTURE) IMPLANT
SUT VIC AB 1 CTX 36 (SUTURE)
SUT VIC AB 1 CTX36XBRD ANBCTR (SUTURE) IMPLANT
SUT VIC AB 2-0 CT1 27 (SUTURE) ×1
SUT VIC AB 2-0 CT1 TAPERPNT 27 (SUTURE) ×1 IMPLANT
SUT VIC AB 2-0 CTX 27 (SUTURE) IMPLANT
SUT VIC AB 3-0 SH 27 (SUTURE)
SUT VIC AB 3-0 SH 27X BRD (SUTURE) IMPLANT
SUT VIC AB 3-0 X1 27 (SUTURE) IMPLANT
SUT VICRYL 4-0 PS2 18IN ABS (SUTURE) IMPLANT
SUTURE E-PAK OPEN HEART (SUTURE) ×2 IMPLANT
SYS ATRICLIP LAA EXCLUSION 45 (CLIP) ×2 IMPLANT
SYSTEM SAHARA CHEST DRAIN ATS (WOUND CARE) ×4 IMPLANT
TAPE CLOTH SOFT 2X10 (GAUZE/BANDAGES/DRESSINGS) ×4 IMPLANT
TOWEL OR 17X24 6PK STRL BLUE (TOWEL DISPOSABLE) ×8 IMPLANT
TOWEL OR 17X26 10 PK STRL BLUE (TOWEL DISPOSABLE) ×8 IMPLANT
TRAY FOLEY IC TEMP SENS 14FR (CATHETERS) ×4 IMPLANT
TUBE CONNECTING 12X1/4 (SUCTIONS) ×2 IMPLANT
TUBE SUCT INTRACARD DLP 20F (MISCELLANEOUS) ×2 IMPLANT
TUBING INSUFFLATION 10FT LAP (TUBING) ×2 IMPLANT
UNDERPAD 30X30 INCONTINENT (UNDERPADS AND DIAPERS) ×4 IMPLANT
VALVE MAGNA EASE AORTIC 25MM (Prosthesis & Implant Heart) ×2 IMPLANT
VENT LEFT HEART 12002 (CATHETERS) ×2
WATER STERILE IRR 1000ML POUR (IV SOLUTION) ×8 IMPLANT
YANKAUER SUCT BULB TIP NO VENT (SUCTIONS) ×2 IMPLANT

## 2012-04-30 NOTE — Anesthesia Procedure Notes (Addendum)
Procedures PA catheter:  Routine monitors. Timeout, sterile prep, drape, FBP R neck.  Trendelenburg position.  1% Lido local, finder and trocar RIJ 1st pass with US guidance.  Cordis placed over J wire. PA catheter in easily.  Sterile dressing applied.  Patient tolerated well, VSS.  Sherron Ales, MD

## 2012-04-30 NOTE — Op Note (Signed)
CARDIOTHORACIC SURGERY OPERATIVE NOTE  Date of Procedure:  04/30/2012  Preoperative Diagnosis:   Severe Aortic Stenosis  Severe 3-vessel Coronary Artery Disease  Persistent Atrial Fibrillation  Postoperative Diagnosis: Same  Procedure:    Aortic Valve Replacement  Edwards Magna Ease Pericardial Tissue Valve (size 25mm, model # 3300TFX, serial # [id number])   Coronary Artery Bypass Grafting x 3   Left Internal Mammary Artery to Distal Left Anterior Descending Coronary Artery  Saphenous Vein Graft to First Obtuse Marginal Branch of Left Circumflex Coronary Artery  Saphenous Vein Graft to Second Obtuse Marginal Branch of Left Circumflex Coronary Artery  Endoscopic Vein Harvest from Right Thigh and Lower Leg   Maze Procedure  Left side lesion set using bipolar radiofrequency and cryothermy ablation  Clipping of left atrial appendage   Surgeon: Salvatore Decent. Cornelius Moras, MD  Assistant: Doree Fudge, PA-C  Anesthesia: Raiford Simmonds, MD   Operative Findings:  Severe calcific aortic stenosis   Mild LV systolic dysfunction   Moderate LVH with diastolic dysfunction   Good quality LIMA and SVG conduit   Poor quality target vessels for grafting         BRIEF CLINICAL NOTE AND INDICATIONS FOR SURGERY  Patient is a 78 year old white male from [place] with aortic stenosis, persistent atrial fibrillation, congestive heart failure, hypertension, type 2 diabetes mellitus, and degenerative arthritis. The patient describes a several year gradual decline in his overall functional status that seems to be multifactorial. He has developed progressive exertional shortness of breath as well as generalized weakness. He initially presented in early May of this year to see Dr. Darrold Junker in [place] where he was noted to be in congestive heart failure. He was admitted to the hospital at [facility] during which time he had an echocardiogram and left and right heart  catheterization. He was noted to have dilated nonischemic cardiomyopathy with ejection fraction estimated 24% by catheterization. At the time his aortic stenosis was felt not to be significant. Although full details of that hospitalization are not currently available, he apparently improved symptomatically with diuresis of more than 25 pounds off his baseline weight.  He was later referred to Dr. Graciela Husbands for possible placement of a defibrillator because of his underlying cardiomyopathy. A follow up echocardiogram was performed that revealed some improvement of his ejection fraction to 40-45% and what appeared to be severe aortic stenosis with peak and mean transvalvular gradients of 51 and 29 mmHg, respectively with calculated valve area estimated to be 0.8 cm2.    The patient describes a gradual progression of symptoms of exertional shortness of breath. He denies resting shortness of breath, PND, and orthopnea. He has had some problems with lower extremity edema, but this has been improved on medical therapy. He has never had any chest pain or chest tightness either with activity or at rest. He has not had any dizzy spells nor syncope. From a functional standpoint the patient is also limited to generalized weakness. The patient has chronic pain in his lower back left hip and leg which limits him some, and he also reports having some difficulty getting up from a chair to stand due to weakness in his legs.  However, he states that with ambulation his primary limitation is shortness of breath. He also has complaints of worsening intention tremor and generalized weakness. He states that beyond this just doesn't feel like doing much of anything any longer and he doesn't know why.  He was referred to the multidisciplinary heart valve clinic  to discuss possible treatment options for management of severe symptomatic aortic stenosis.  He was originally seen on 03/14/2012. Since then he underwent DC cardioversion by Dr.  Darrold Junker. Patient returned to the multidisciplinary heart valve clinic last month and underwent followup echocardiogram which revealed normalization of LV systolic function. He reported that since cardioversion he doesn't really feel any better than he did prior to cardioversion. His primary complaint remains that he gives out and get short of breath with physical activity. He has not had resting shortness of breath. He has not had any chest discomfort but he does note that occasionally he gets dull aching pain radiating up into his jaw. He states that this occurs primarily if he is under stress or aggravated, and episodes typically only last for a few minutes and all much longer. He has not had any dizzy spells or syncope. He denies any PND, orthopnea, or lower extremity edema.  The remainder of his review of systems is unchanged from previously.  The patient has been counseled at length regarding the indications, risks and potential benefits of surgery.  All questions have been answered, and the patient provides full informed consent for the operation as described.      DETAILS OF THE OPERATIVE PROCEDURE  The patient is brought to the operating room on the above mentioned date and central monitoring was established by the anesthesia team including placement of Swan-Ganz catheter and radial arterial line. The patient is placed in the supine position on the operating table.  Intravenous antibiotics are administered. General endotracheal anesthesia is induced uneventfully. A Foley catheter is placed.  Baseline transesophageal echocardiogram was performed.  Findings were notable for severe calcific aortic stenosis. There is moderate left ventricular hypertrophy with evidence of diastolic dysfunction. There is mild left ventricular dysfunction with overall ejection fraction estimated greater than 50%. There is trace aortic insufficiency. There is mild mitral regurgitation. No other significant  abnormalities are noted.  The patient's chest, abdomen, both groins, and both lower extremities are prepared and draped in a sterile manner. A time out procedure is performed.  A median sternotomy incision was performed and the left internal mammary artery is dissected from the chest wall and prepared for bypass grafting. The left internal mammary artery is notably good quality conduit. Simultaneously, saphenous vein is obtained from the patient's right thigh using endoscopic vein harvest technique. Initially an incision is made in the left thigh but the vein on that side appears small and is not removed. They on the right side appears normal.  The saphenous vein is notably good quality conduit. After removal of the saphenous vein, the small surgical incisions in the lower extremity are closed with absorbable suture. Following systemic heparinization, the left internal mammary artery was transected distally noted to have excellent flow.  The pericardium is opened. The ascending aorta is normal in appearance. The right common femoral vein is cannulated using the Seldinger technique and a guidewire advanced into the right atrium using TEE guidance.  The patient is heparinized systemically and the femoral vein cannulated using a 22 Fr long femoral venous cannula.  The ascending aorta is cannulated for cardiopulmonary bypass.  Adequate heparinization is verified.   A retrograde cardioplegia cannula is placed through the right atrium into the coronary sinus.  The entire pre-bypass portion of the operation was notable for stable hemodynamics.  Cardiopulmonary bypass was begun and the surface of the heart is inspected.  A second venous cannula is placed directly into the superior vena cava.  The surface of the heart inspected. Distal target vessels are selected for coronary artery bypass grafting.  The posterior descending branch of the distal right coronary artery was too small and diffusely diseased for  grafting.  A cardioplegia cannula is placed in the ascending aorta.  A temperature probe was placed in the interventricular septum.  The patient is cooled to 32C systemic temperature.  The aortic cross clamp is applied and cold blood cardioplegia is delivered initially in an antegrade fashion through the aortic root.  Supplemental cardioplegia is given retrograde through the coronary sinus catheter.  Iced saline slush is applied for topical hypothermia.  The initial cardioplegic arrest is rapid with early diastolic arrest.  Repeat doses of cardioplegia are administered intermittently throughout the entire cross clamp portion of the operation through the aortic root, through subsequently placed vein grafts, and through the coronary sinus catheter in order to maintain completely flat electrocardiogram and septal myocardial temperature below 15C.  Myocardial protection was felt to be excellent.  The Atricure bipolar radiofrequency ablation clamp is used for all radiofrequency ablation lesions for the maze procedure.  The heart is retracted towards the surgeon's side and the left sided pulmonary veins exposed.  An elliptical ablation lesion is created around the base of the left sided pulmonary veins.  A similar elliptical lesion was created around the base of the left atrial appendage.  The left atrial appendage was obliterated by deploying a 45 mm Atricure left atrial appendage clip across the appendage.  The heart was replaced into the pericardial sac.  The following distal coronary artery bypass grafts were performed:   The first obtuse marginal branch of the left circumflex coronary artery was grafted using a reversed saphenous vein graft in an end-to-side fashion.  At the site of distal anastomosis the target vessel was poor quality and measured approximately 1.5 mm in diameter.  The second obtuse marginal branch of the left circumflex coronary artery was grafted using a reversed saphenous vein  graft in an end-to-side fashion.  At the site of distal anastomosis the target vessel was poor quality and measured approximately 1.6 mm in diameter.  The distal left anterior coronary artery was grafted with the left internal mammary artery in an end-to-side fashion.  At the site of distal anastomosis the target vessel was poor quality and measured approximately 1.0 mm in diameter.  A left atriotomy incision was performed through the interatrial groove and extended partially across the back wall of the left atrium after opening the oblique sinus inferiorly.  The floor of the left atrium and the mitral valve were exposed using a self-retaining retractor.  An ablation lesion was placed around the right sided pulmonary veins using the bipolar clamp with one limb of the clamp along the endocardial surface and one along the epicardial surface posteriorly.  A bipolar ablation lesion was placed across the dome of the left atrium from the cephalad apex of the atriotomy incision to reach the cephalad apex of the elliptical lesion around the left sided pulmonary veins.  A similar bipolar lesion was placed across the back wall of the left atrium from the caudad apex of the atriotomy incision to reach the caudad apex of the elliptical lesion around the left sided pulmonary veins, thereby completing a box.  Finally another bipolar lesion was placed across the back wall of the left atrium from the caudad apex of the atriotomy incision towards the posterior mitral valve annulus.  This lesion was completed along the endocardial surface onto  the posterior mitral annulus with a 3 minute duration cryothermy lesion, followed by a second cryothermy lesion along the posterior epicardial surface of the left atrium to the coronary sinus.  This completes the entire left side lesion set of the Cox maze procedure.  The atriotoAndmy was closed using a 2-layer closure of running 3-0 Prolene suture after placing a sump drain across the  mitral valve to serve as a left ventricular vent.  An oblique transverse aortotomy incision was performed.  The aortic valve was inspected and notable for severe aortic stenosis.  The aortic valve leaflets were excised sharply and the aortic annulus decalcified.  Decalcification was notably straightforward.  The aortic annulus was sized to accept a 25 mm prosthesis.  The aortic root and left ventricle were irrigated with copious cold saline solution.  Aortic valve replacement was performed using interrupted horizontal mattress 2-0 Ethibond pledgeted sutures with pledgets in the subannular position.  An [facility] Ease pericardial tissue valve (size 25 mm, model # 3300TFX, serial # [id number]) was implanted uneventfully. The valve seated appropriately with adequate space beneath the left main and right coronary artery.  The aortotomy was closed using a 2-layer closure of running 4-0 Prolene suture.  All proximal vein graft anastomoses were placed directly to the ascending aorta prior to removal of the aortic cross clamp.  The septal myocardial temperature rose rapidly after reperfusion of the left internal mammary artery graft.  One final dose of warm retrograde "hot shot" cardioplegia was administered through the coronary sinus catheter while all air was evacuated through the aortic root.  The aortic cross clamp was removed after a total cross clamp time of 176 minutes.  All proximal and distal coronary anastomoses were inspected for hemostasis and appropriate graft orientation. Epicardial pacing wires are fixed to the right ventricular outflow tract and to the right atrial appendage. The patient is rewarmed to 37C temperature. The aortic and left ventricular vents were removed.  The patient is weaned and disconnected from cardiopulmonary bypass.  The patient's rhythm at separation from bypass was AV paced.  The patient was weaned from cardioplegic bypass on low dose dopamine and milrinone infusions.   Total cardiopulmonary bypass time for the operation was 207 minutes.  Followup transesophageal echocardiogram performed after separation from bypass revealed a well-seated bioprosthetic tissue valve in the aortic position that was functioning normally.  There was no perivalvular leak.  There were otherwise no changes from the preoperative exam.  Ventricular function was remarkably vigorous.  The aortic and superior vena cava cannula were removed uneventfully. Protamine was administered to reverse the anticoagulation. The femoral venous cannula was removed and manual pressure held on the groin for 30 minutes.  Protamine was administered to reverse the anticoagulation. The mediastinum and pleural space were inspected for hemostasis and irrigated with saline solution. The mediastinum and both pleural spaces were drained using 4 chest tubes placed through separate stab incisions inferiorly.  The soft tissues anterior to the aorta were reapproximated loosely. The sternum is closed with double strength sternal wire. The soft tissues anterior to the sternum were closed in multiple layers and the skin is closed with a running subcuticular skin closure.  The post-bypass portion of the operation was notable for stable rhythm and hemodynamics.  No blood products were administered during the operation.  The patient tolerated the procedure well and is transported to the surgical intensive care in stable condition. There are no intraoperative complications. All sponge instrument and needle counts are verified correct  at completion of the operation.   Salvatore Decent. Cornelius Moras MD 04/30/2012 3:46 PM

## 2012-04-30 NOTE — Preoperative (Signed)
Beta Blockers   Reason not to administer Beta Blockers:Not Applicable, last dose 04/30/12 per patient

## 2012-04-30 NOTE — Progress Notes (Signed)
S/p AVR CABG Intubated, sedated Some bleeding- coagulopathic, FFP and platelets being given  BP 90/59  Pulse 80  Temp 96.3 F (35.7 C) (Oral)  Resp 12  Wt 256 lb (116.121 kg)  SpO2 99% 30/13, CI 1.6- volume being given

## 2012-04-30 NOTE — Transfer of Care (Signed)
Immediate Anesthesia Transfer of Care Note  Patient: Christopher Walker  Procedure(s) Performed: Procedure(s) (LRB) with comments: CORONARY ARTERY BYPASS GRAFTING (CABG) (N/A) AORTIC VALVE REPLACEMENT (AVR) (N/A) MAZE (N/A)  Patient Location: SICU  Anesthesia Type: General  Level of Consciousness: Patient remains intubated per anesthesia plan  Airway & Oxygen Therapy: Patient remains intubated per anesthesia plan and Patient placed on Ventilator (see vital sign flow sheet for setting)  Post-op Assessment: Report given to PACU RN  Post vital signs: Reviewed and stable  Complications: No apparent anesthesia complications

## 2012-04-30 NOTE — Interval H&P Note (Signed)
History and Physical Interval Note:  04/30/2012 8:36 AM  Christopher Walker  has presented today for surgery, with the diagnosis of CAD AS A-fib  The various methods of treatment have been discussed with the patient and family. After consideration of risks, benefits and other options for treatment, the patient has consented to  Procedure(s) (LRB) with comments: CORONARY ARTERY BYPASS GRAFTING (CABG) (N/A) AORTIC VALVE REPLACEMENT (AVR) (N/A) MAZE (N/A) as a surgical intervention .  The patient's history has been reviewed, patient examined, no change in status, stable for surgery.  I have reviewed the patient's chart and labs.  Questions were answered to the patient's satisfaction.     Tola Meas H

## 2012-04-30 NOTE — OR Nursing (Signed)
First call to sicu @ 1500, second call to sicu at 1540

## 2012-04-30 NOTE — Anesthesia Postprocedure Evaluation (Signed)
  Anesthesia Post-op Note  Patient: Joelene Millin Klammer  Procedure(s) Performed: Procedure(s) (LRB) with comments: CORONARY ARTERY BYPASS GRAFTING (CABG) (N/A) AORTIC VALVE REPLACEMENT (AVR) (N/A) MAZE (N/A)  Patient Location: SICU  Anesthesia Type: General  Level of Consciousness: Patient remains intubated per anesthesia plan  Airway and Oxygen Therapy: Patient remains intubated per anesthesia plan  Post-op Pain: none  Post-op Assessment: Post-op Vital signs reviewed  Post-op Vital Signs: Reviewed and stable  Complications: No apparent anesthesia complications

## 2012-04-30 NOTE — Progress Notes (Signed)
Echocardiogram Echocardiogram Transesophageal has been performed.  Cosmo Tetreault 04/30/2012, 9:08 AM

## 2012-04-30 NOTE — Anesthesia Preprocedure Evaluation (Addendum)
Anesthesia Evaluation  Patient identified by MRN, date of birth, ID band Patient awake    Reviewed: Allergy & Precautions, H&P , NPO status , Patient's Chart, lab work & pertinent test results  Airway Mallampati: II TM Distance: >3 FB Neck ROM: full    Dental  (+) Edentulous Upper, Partial Lower and Dental Advisory Given   Pulmonary  breath sounds clear to auscultation        Cardiovascular hypertension, + CAD and +CHF + dysrhythmias Atrial Fibrillation + Valvular Problems/Murmurs AS     Neuro/Psych Anxiety    GI/Hepatic GERD-  ,  Endo/Other  diabetes, Type 2obese  Renal/GU      Musculoskeletal   Abdominal   Peds  Hematology   Anesthesia Other Findings   Reproductive/Obstetrics                          Anesthesia Physical Anesthesia Plan  ASA: III  Anesthesia Plan: General   Post-op Pain Management:    Induction: Intravenous  Airway Management Planned: Oral ETT  Additional Equipment: Arterial line, CVP, PA Cath and TEE  Intra-op Plan:   Post-operative Plan: Post-operative intubation/ventilation  Informed Consent: I have reviewed the patients History and Physical, chart, labs and discussed the procedure including the risks, benefits and alternatives for the proposed anesthesia with the patient or authorized representative who has indicated his/her understanding and acceptance.     Plan Discussed with: CRNA and Surgeon  Anesthesia Plan Comments:         Anesthesia Quick Evaluation

## 2012-04-30 NOTE — Brief Op Note (Signed)
04/30/2012  3:46 PM  PATIENT:  Christopher Walker  78 y.o. male  PRE-OPERATIVE DIAGNOSIS:  CAD AS A-fib  POST-OPERATIVE DIAGNOSIS:  Coronary Artery Disease Aortic Senosis Atrial fibrillation  PROCEDURE:  Procedure(s) (LRB) with comments: CORONARY ARTERY BYPASS GRAFTING (CABG) (N/A) AORTIC VALVE REPLACEMENT (AVR) (N/A) MAZE (N/A)  SURGEON:    Purcell Nails, MD  ASSISTANTS:  Doree Fudge, PA-C  ANESTHESIA:   Raiford Simmonds, MD  CROSSCLAMP TIME:   176'  CARDIOPULMONARY BYPASS TIME: 207'  FINDINGS:  Severe calcific aortic stenosis  Mild LV systolic dysfunction  Moderate LVH with diastolic dysfunction  Good quality LIMA and SVG conduit  Poor quality target vessels for grafting  COMPLICATIONS: none  PATIENT DISPOSITION:   TO SICU IN STABLE CONDITION  OWEN,CLARENCE H 04/30/2012 3:46 PM

## 2012-05-01 ENCOUNTER — Inpatient Hospital Stay (HOSPITAL_COMMUNITY): Payer: Medicare Other

## 2012-05-01 LAB — POCT I-STAT 3, ART BLOOD GAS (G3+)
Acid-Base Excess: 2 mmol/L (ref 0.0–2.0)
Bicarbonate: 26.3 mEq/L — ABNORMAL HIGH (ref 20.0–24.0)
O2 Saturation: 94 %
Patient temperature: 37.7
TCO2: 28 mmol/L (ref 0–100)
TCO2: 27 mmol/L (ref 0–100)
pCO2 arterial: 37.4 mmHg (ref 35.0–45.0)
pH, Arterial: 7.386 (ref 7.350–7.450)
pO2, Arterial: 198 mmHg — ABNORMAL HIGH (ref 80.0–100.0)

## 2012-05-01 LAB — GLUCOSE, CAPILLARY
Glucose-Capillary: 130 mg/dL — ABNORMAL HIGH (ref 70–99)
Glucose-Capillary: 132 mg/dL — ABNORMAL HIGH (ref 70–99)
Glucose-Capillary: 124 mg/dL — ABNORMAL HIGH (ref 70–99)
Glucose-Capillary: 121 mg/dL — ABNORMAL HIGH (ref 70–99)
Glucose-Capillary: 102 mg/dL — ABNORMAL HIGH (ref 70–99)

## 2012-05-01 LAB — BASIC METABOLIC PANEL
BUN: 13 mg/dL (ref 6–23)
CO2: 25 mEq/L (ref 19–32)
Chloride: 107 mEq/L (ref 96–112)
Creatinine, Ser: 0.82 mg/dL (ref 0.50–1.35)
GFR calc Af Amer: >90 mL/min (ref >90)
Potassium: 3.6 mEq/L (ref 3.5–5.1)

## 2012-05-01 LAB — POCT I-STAT, CHEM 8
BUN: 15 mg/dL (ref 6–23)
Chloride: 106 mEq/L (ref 96–112)
Creatinine, Ser: 1.1 mg/dL (ref 0.50–1.35)
Hemoglobin: 8.8 g/dL — ABNORMAL LOW (ref 13.0–17.0)
Potassium: 4.1 mEq/L (ref 3.5–5.1)
Sodium: 141 mEq/L (ref 135–145)

## 2012-05-01 LAB — CBC
HCT: 24.6 % — ABNORMAL LOW (ref 39.0–52.0)
HCT: 26 % — ABNORMAL LOW (ref 39.0–52.0)
Hemoglobin: 8.4 g/dL — ABNORMAL LOW (ref 13.0–17.0)
MCH: 31.2 pg (ref 26.0–34.0)
MCV: 90.1 fL (ref 78.0–100.0)
MCV: 91.2 fL (ref 78.0–100.0)
RBC: 2.73 MIL/uL — ABNORMAL LOW (ref 4.22–5.81)
RBC: 2.85 MIL/uL — ABNORMAL LOW (ref 4.22–5.81)
RDW: 15.1 % (ref 11.5–15.5)
WBC: 12.9 10*3/uL — ABNORMAL HIGH (ref 4.0–10.5)
WBC: 17.5 10*3/uL — ABNORMAL HIGH (ref 4.0–10.5)

## 2012-05-01 LAB — PREPARE FRESH FROZEN PLASMA
Unit division: 0
Unit division: 0
Unit division: 0

## 2012-05-01 LAB — PREPARE PLATELET PHERESIS: Unit division: 0

## 2012-05-01 LAB — CREATININE, SERUM: GFR calc Af Amer: 81 mL/min — ABNORMAL LOW (ref >90)

## 2012-05-01 LAB — PREPARE RBC (CROSSMATCH)

## 2012-05-01 MED ORDER — INSULIN ASPART 100 UNIT/ML ~~LOC~~ SOLN
0.0000 [IU] | SUBCUTANEOUS | Status: DC
  Administered 2012-05-01: 4 [IU] via SUBCUTANEOUS
  Administered 2012-05-01: 2 [IU] via SUBCUTANEOUS
  Administered 2012-05-01: 4 [IU] via SUBCUTANEOUS

## 2012-05-01 MED ORDER — FUROSEMIDE 10 MG/ML IJ SOLN
40.0000 mg | Freq: Three times a day (TID) | INTRAMUSCULAR | Status: AC
  Administered 2012-05-01 (×3): 40 mg via INTRAVENOUS
  Filled 2012-05-01: qty 4

## 2012-05-01 MED ORDER — INSULIN GLARGINE 100 UNIT/ML ~~LOC~~ SOLN
20.0000 [IU] | Freq: Two times a day (BID) | SUBCUTANEOUS | Status: DC
  Administered 2012-05-01 (×2): 20 [IU] via SUBCUTANEOUS

## 2012-05-01 MED ORDER — POTASSIUM CHLORIDE 10 MEQ/50ML IV SOLN
10.0000 meq | INTRAVENOUS | Status: AC | PRN
  Administered 2012-05-01 (×3): 10 meq via INTRAVENOUS

## 2012-05-01 MED ORDER — MORPHINE SULFATE 2 MG/ML IJ SOLN
2.0000 mg | INTRAMUSCULAR | Status: DC | PRN
  Administered 2012-05-01 – 2012-05-02 (×6): 2 mg via INTRAVENOUS
  Filled 2012-05-01 (×6): qty 1

## 2012-05-01 MED FILL — Magnesium Sulfate Inj 50%: INTRAMUSCULAR | Qty: 10 | Status: AC

## 2012-05-01 MED FILL — Potassium Chloride Inj 2 mEq/ML: INTRAVENOUS | Qty: 40 | Status: AC

## 2012-05-01 NOTE — Progress Notes (Signed)
   CARDIOTHORACIC SURGERY PROGRESS NOTE   R1 Day Post-Op Procedure(s) (LRB): CORONARY ARTERY BYPASS GRAFTING (CABG) (N/A) AORTIC VALVE REPLACEMENT (AVR) (N/A) MAZE (N/A)  Subjective: Just extubated this morning.  Awake and alert, neuro grossly intact.  Complains of soreness in throat from tubes.  Breathing okay.  Objective: Vital signs: BP Readings from Last 1 Encounters:  05/01/12 109/61   Pulse Readings from Last 1 Encounters:  05/01/12 80   Resp Readings from Last 1 Encounters:  05/01/12 24   Temp Readings from Last 1 Encounters:  05/01/12 99.7 F (37.6 C)     Hemodynamics: PAP: (26-39)/(12-19) 37/18 mmHg CO:  [3.6 L/min-5.8 L/min] 5.5 L/min CI:  [1.5 L/min/m2-2.5 L/min/m2] 2.3 L/min/m2  Physical Exam:  Rhythm:   AAI paced  Breath sounds: Clear anteriorly  Heart sounds:  RRR  Incisions:  Dressings dry  Abdomen:  Soft, non distended, non tender  Extremities:  Warm, well perfused   Intake/Output from previous day: 10/09 0701 - 10/10 0700 In: 11586.6 [I.V.:6191.6; Blood:3535; NG/GT:90; IV Piggyback:1770] Out: 6320 [Urine:2810; Emesis/NG output:300; Blood:1050; Chest Tube:2160] Intake/Output this shift: Total I/O In: 152.4 [I.V.:102.4; IV Piggyback:50] Out: 130 [Urine:30; Chest Tube:100]  Lab Results:  Basename 05/01/12 0425 04/30/12 2341 04/30/12 2030  WBC 12.9* -- 14.7*  HGB 8.4* 7.1* --  HCT 24.6* 21.0* --  PLT 123* -- 138*   BMET:  Basename 05/01/12 0425 04/30/12 2341 04/28/12 1400  NA 139 143 --  K 3.6 3.9 --  CL 107 106 --  CO2 25 -- 22  GLUCOSE 108* 124* --  BUN 13 13 --  CREATININE 0.82 0.90 --  CALCIUM 7.8* -- 9.7    CBG (last 3)   Basename 05/01/12 0733 05/01/12 0353 05/01/12 0253  GLUCAP 93 102* 120*   ABG    Component Value Date/Time   PHART 7.386 05/01/2012 0400   HCO3 26.3* 05/01/2012 0400   TCO2 28 05/01/2012 0400   ACIDBASEDEF 1.0 04/30/2012 1501   O2SAT 100.0 05/01/2012 0400   CXR: Low lung volumes with bibasilar  atelectasis, mild congestion  Assessment/Plan: S/P Procedure(s) (LRB): CORONARY ARTERY BYPASS GRAFTING (CABG) (N/A) AORTIC VALVE REPLACEMENT (AVR) (N/A) MAZE (N/A)  Overall stable POD1 Stable hemodynamics Maintaining NSR so far Postop coagulopathy resolved Expected post op acute blood loss anemia, mild, stable Expected post op volume excess, moderate Type II diabetes mellitus, excellent glycemic control Arthritis and ill-defined neuromuscular disorder   Mobilize  Diuresis  Keep chest tubes in for now  AAI pace for now and restart amiodarone  Ultimately will need PT consult, but probably not ready today     OWEN,CLARENCE H 05/01/2012 9:07 AM

## 2012-05-01 NOTE — Progress Notes (Signed)
Patient ID: Christopher Walker, male   DOB: 09/24/1932, 78 y.o.   MRN: [id number]                   [address]            [place] [address]          [phone]     1 Day Post-Op Procedure(s) (LRB): CORONARY ARTERY BYPASS GRAFTING (CABG) (N/A) AORTIC VALVE REPLACEMENT (AVR) (N/A) MAZE (N/A)  Total Length of Stay:  LOS: 1 day  BP 122/57  Pulse 71  Temp 98.2 F (36.8 C) (Core (Comment))  Resp 22  Wt 279 lb 8.7 oz (126.8 kg)  SpO2 98%  .Intake/Output      10/09 0701 - 10/10 0700 10/10 0701 - 10/11 0700   P.O.  360   I.V. (mL/kg) 6191.6 (48.8) 733.5 (5.8)   Blood 3535    NG/GT 90    IV Piggyback 1770 110   Total Intake(mL/kg) 11586.6 (91.4) 1203.5 (9.5)   Urine (mL/kg/hr) 2810 (0.9) 1080 (0.7)   Emesis/NG output 300    Blood 1050    Chest Tube 2160 550   Total Output 6320 1630   Net +5266.6 -426.5             . sodium chloride 20 mL/hr (04/30/12 2223)  . sodium chloride 20 mL/hr (04/30/12 1630)  . sodium chloride    . DOPamine 3 mcg/kg/min (05/01/12 0800)  . insulin (NOVOLIN-R) infusion 5.1 Units/hr (05/01/12 0700)  . lactated ringers 20 mL/hr (04/30/12 1620)  . milrinone 0.2 mcg/kg/min (05/01/12 1700)  . phenylephrine (NEO-SYNEPHRINE) Adult infusion Stopped (05/01/12 1600)  . DISCONTD: dexmedetomidine Stopped (05/01/12 0655)  . DISCONTD: nitroGLYCERIN Stopped (04/30/12 1630)     Lab Results  Component Value Date   WBC 17.5* 05/01/2012   HGB 8.9* 05/01/2012   HCT 26.0* 05/01/2012   PLT 112* 05/01/2012   GLUCOSE 172* 05/01/2012   ALT 13 04/28/2012   AST 19 04/28/2012   NA 141 05/01/2012   K 4.1 05/01/2012   CL 106 05/01/2012   CREATININE 1.01 05/01/2012   BUN 15 05/01/2012   CO2 25 05/01/2012   INR 1.34 04/30/2012   HGBA1C 7.3* 04/28/2012   Stable day Slow mentation but follows commands  Delight Ovens MD  Beeper [phone] Office [phone] 05/01/2012 6:30 PM

## 2012-05-01 NOTE — Procedures (Signed)
Extubation Procedure Note  Patient Details:   Name: Christopher Walker DOB: Sep 16, 1932 MRN: [id number]   Airway Documentation:     Evaluation  O2 sats: stable throughout Complications: No apparent complications Patient did tolerate procedure well. Bilateral Breath Sounds: Clear Suctioning: Airway Yes  Newt Lukes 05/01/2012, 9:02 AM

## 2012-05-02 ENCOUNTER — Encounter (HOSPITAL_COMMUNITY): Payer: Self-pay | Admitting: Thoracic Surgery (Cardiothoracic Vascular Surgery)

## 2012-05-02 ENCOUNTER — Inpatient Hospital Stay (HOSPITAL_COMMUNITY): Payer: Medicare Other

## 2012-05-02 LAB — CBC
MCV: 90.8 fL (ref 78.0–100.0)
Platelets: 111 10*3/uL — ABNORMAL LOW (ref 150–400)
RBC: 2.94 MIL/uL — ABNORMAL LOW (ref 4.22–5.81)
WBC: 17.7 10*3/uL — ABNORMAL HIGH (ref 4.0–10.5)

## 2012-05-02 LAB — TYPE AND SCREEN
Antibody Screen: NEGATIVE
Unit division: 0
Unit division: 0
Unit division: 0

## 2012-05-02 LAB — BASIC METABOLIC PANEL
CO2: 26 mEq/L (ref 19–32)
Calcium: 8 mg/dL — ABNORMAL LOW (ref 8.4–10.5)
Chloride: 105 mEq/L (ref 96–112)
Potassium: 4 mEq/L (ref 3.5–5.1)
Sodium: 140 mEq/L (ref 135–145)

## 2012-05-02 LAB — GLUCOSE, CAPILLARY
Glucose-Capillary: 128 mg/dL — ABNORMAL HIGH (ref 70–99)
Glucose-Capillary: 142 mg/dL — ABNORMAL HIGH (ref 70–99)
Glucose-Capillary: 169 mg/dL — ABNORMAL HIGH (ref 70–99)
Glucose-Capillary: 174 mg/dL — ABNORMAL HIGH (ref 70–99)
Glucose-Capillary: 176 mg/dL — ABNORMAL HIGH (ref 70–99)
Glucose-Capillary: 177 mg/dL — ABNORMAL HIGH (ref 70–99)
Glucose-Capillary: 179 mg/dL — ABNORMAL HIGH (ref 70–99)
Glucose-Capillary: 232 mg/dL — ABNORMAL HIGH (ref 70–99)
Glucose-Capillary: 149 mg/dL — ABNORMAL HIGH (ref 70–99)

## 2012-05-02 MED ORDER — INSULIN ASPART 100 UNIT/ML ~~LOC~~ SOLN: 0.0000 [IU] | SUBCUTANEOUS | Status: DC

## 2012-05-02 MED ORDER — METOPROLOL TARTRATE 25 MG PO TABS
25.0000 mg | ORAL_TABLET | Freq: Two times a day (BID) | ORAL | Status: DC
  Administered 2012-05-02 – 2012-05-07 (×9): 25 mg via ORAL
  Filled 2012-05-02 (×13): qty 1

## 2012-05-02 MED ORDER — FUROSEMIDE 10 MG/ML IJ SOLN
10.0000 mg/h | INTRAVENOUS | Status: DC
  Administered 2012-05-02 – 2012-05-03 (×2): 10 mg/h via INTRAVENOUS
  Filled 2012-05-02 (×4): qty 25

## 2012-05-02 MED ORDER — INSULIN ASPART 100 UNIT/ML ~~LOC~~ SOLN
0.0000 [IU] | SUBCUTANEOUS | Status: DC
  Administered 2012-05-02: 4 [IU] via SUBCUTANEOUS
  Administered 2012-05-02: 2 [IU] via SUBCUTANEOUS

## 2012-05-02 MED ORDER — ASPIRIN EC 81 MG PO TBEC
81.0000 mg | DELAYED_RELEASE_TABLET | Freq: Every day | ORAL | Status: DC
  Administered 2012-05-02 – 2012-05-07 (×6): 81 mg via ORAL
  Filled 2012-05-02 (×6): qty 1

## 2012-05-02 MED ORDER — INSULIN GLARGINE 100 UNIT/ML ~~LOC~~ SOLN
32.0000 [IU] | Freq: Two times a day (BID) | SUBCUTANEOUS | Status: DC
  Administered 2012-05-02 – 2012-05-04 (×6): 32 [IU] via SUBCUTANEOUS

## 2012-05-02 MED ORDER — WARFARIN SODIUM 2.5 MG PO TABS
2.5000 mg | ORAL_TABLET | Freq: Every day | ORAL | Status: DC
  Administered 2012-05-02 – 2012-05-04 (×3): 2.5 mg via ORAL
  Filled 2012-05-02 (×4): qty 1

## 2012-05-02 MED ORDER — POTASSIUM CHLORIDE 10 MEQ/50ML IV SOLN
10.0000 meq | INTRAVENOUS | Status: AC
  Administered 2012-05-02 (×2): 10 meq via INTRAVENOUS

## 2012-05-02 MED ORDER — ASPIRIN 81 MG PO CHEW
CHEWABLE_TABLET | ORAL | Status: AC
  Filled 2012-05-02: qty 1

## 2012-05-02 MED ORDER — WARFARIN - PHYSICIAN DOSING INPATIENT
Freq: Every day | Status: DC
  Administered 2012-05-04 – 2012-05-06 (×3)

## 2012-05-02 MED ORDER — SODIUM CHLORIDE 0.9 % IV SOLN
INTRAVENOUS | Status: DC
  Administered 2012-05-02: 14.8 [IU]/h via INTRAVENOUS
  Filled 2012-05-02 (×2): qty 1

## 2012-05-02 NOTE — Progress Notes (Addendum)
   CARDIOTHORACIC SURGERY PROGRESS NOTE   R2 Days Post-Op Procedure(s) (LRB): CORONARY ARTERY BYPASS GRAFTING (CABG) (N/A) AORTIC VALVE REPLACEMENT (AVR) (N/A) MAZE (N/A)  Subjective: Feels nauseated this morning but overall looks better.  Objective: Vital signs: BP Readings from Last 1 Encounters:  05/02/12 142/74   Pulse Readings from Last 1 Encounters:  05/02/12 93   Resp Readings from Last 1 Encounters:  05/02/12 32   Temp Readings from Last 1 Encounters:  05/02/12 98.3 F (36.8 C) Oral    Hemodynamics: PAP: (35-60)/(10-20) 50/18 mmHg CO:  [6.3 L/min-8.2 L/min] 6.3 L/min CI:  [2.7 L/min/m2-3.5 L/min/m2] 2.7 L/min/m2  Physical Exam:  Rhythm:   sinus  Breath sounds: Few crackles  Heart sounds:  RRR  Incisions:  Dressings dry  Abdomen:  soft  Extremities:  Warm, swollen   Intake/Output from previous day: 10/10 0701 - 10/11 0700 In: 1853 [P.O.:480; I.V.:1205; IV Piggyback:168] Out: 3110 [Urine:2120; Chest Tube:990] Intake/Output this shift: Total I/O In: 26.5 [I.V.:26.5] Out: 50 [Urine:50]  Lab Results:  [facility] 05/02/12 0410 05/01/12 1635  WBC 17.7* 17.5*  HGB 8.9* 8.9*  HCT 26.7* 26.0*  PLT 111* 112*   BMET:  Basename 05/02/12 0410 05/01/12 1635 05/01/12 1630 05/01/12 0425  NA 140 -- 141 --  K 4.0 -- 4.1 --  CL 105 -- 106 --  CO2 26 -- -- 25  GLUCOSE 192* -- 172* --  BUN 18 -- 15 --  CREATININE 1.00 1.01 -- --  CALCIUM 8.0* -- -- 7.8*    CBG (last 3)   Basename 05/02/12 0740 05/02/12 0406 05/01/12 2322  GLUCAP 127* 171* 221*   ABG    Component Value Date/Time   PHART 7.454* 05/01/2012 0825   HCO3 26.1* 05/01/2012 0825   TCO2 21 05/01/2012 1630   ACIDBASEDEF 1.0 04/30/2012 1501   O2SAT 94.0 05/01/2012 0825   CXR: *RADIOLOGY REPORT*  Clinical Data: Postop coronary artery bypass grafting  PORTABLE CHEST - 1 VIEW  Comparison: 05/01/2012  Findings: The patient is status post median sternotomy and CABG.  The endotracheal tube has  been removed. The Swan-Ganz catheter has  been removed. The introducer sheath remains in place via a right  internal jugular approach. Mediastinal drain is stable. A left  chest tube and mediastinal drain remain. No pneumothorax is noted.  Low lung volumes are present with associated bibasilar atelectasis.  A small left pleural effusion is seen. The right costophrenic  angle has been excluded compromising evaluation for residual small  right effusion. Pulmonary vascular congestion with suspected mild  interstitial edema.  IMPRESSION:  Support apparatus as above. Low lung volumes with bibasilar volume  loss post extubation. Vascular congestion with persistent mild  interstitial edema  Original Report Authenticated By: Bertha Stakes, M.D.    Assessment/Plan: S/P Procedure(s) (LRB): CORONARY ARTERY BYPASS GRAFTING (CABG) (N/A) AORTIC VALVE REPLACEMENT (AVR) (N/A) MAZE (N/A)  Overall stable POD2 Expected post op acute blood loss anemia, mild, stable Expected post op volume excess, moderate Postop atelectasis Hypertension Type II diabetes mellitus, excellent glycemic control but back on insulin drip overnight Preexisting mild, poorly-defined neuromuscular disorder   Mobilize  D/C mediastinal tubes  D/C dopamine  Increase beta blocker  Start lasix drip  Leave Foley cath in place one more day for diuresis  Increase lantus insulin and wean drip off  Start coumadin slowly  PT/OT consult   Kenika Sahm H 05/02/2012 8:41 AM

## 2012-05-02 NOTE — Progress Notes (Signed)
PT Cancellation Note  Patient Details Name: Christopher Walker MRN: [id number] DOB: [birth year]   Cancelled Treatment:    Reason Eval/Treat Not Completed: Other (comment) (Pt feeling very sick currently. RN in room.)   Cephus Shelling 05/02/2012, 1:15 PM  05/02/2012 Cephus Shelling, PT, DPT [phone]

## 2012-05-02 NOTE — Progress Notes (Signed)
TCTS BRIEF SICU PROGRESS NOTE  2 Days Post-Op  S/P Procedure(s) (LRB): CORONARY ARTERY BYPASS GRAFTING (CABG) (N/A) AORTIC VALVE REPLACEMENT (AVR) (N/A) MAZE (N/A)   Stable day  Plan: Continue current plan  Christopher Walker H 05/02/2012 4:12 PM

## 2012-05-03 ENCOUNTER — Inpatient Hospital Stay (HOSPITAL_COMMUNITY): Payer: Medicare Other

## 2012-05-03 LAB — GLUCOSE, CAPILLARY
Glucose-Capillary: 106 mg/dL — ABNORMAL HIGH (ref 70–99)
Glucose-Capillary: 107 mg/dL — ABNORMAL HIGH (ref 70–99)
Glucose-Capillary: 110 mg/dL — ABNORMAL HIGH (ref 70–99)
Glucose-Capillary: 114 mg/dL — ABNORMAL HIGH (ref 70–99)
Glucose-Capillary: 115 mg/dL — ABNORMAL HIGH (ref 70–99)
Glucose-Capillary: 120 mg/dL — ABNORMAL HIGH (ref 70–99)
Glucose-Capillary: 122 mg/dL — ABNORMAL HIGH (ref 70–99)
Glucose-Capillary: 126 mg/dL — ABNORMAL HIGH (ref 70–99)
Glucose-Capillary: 129 mg/dL — ABNORMAL HIGH (ref 70–99)
Glucose-Capillary: 137 mg/dL — ABNORMAL HIGH (ref 70–99)
Glucose-Capillary: 138 mg/dL — ABNORMAL HIGH (ref 70–99)
Glucose-Capillary: 158 mg/dL — ABNORMAL HIGH (ref 70–99)

## 2012-05-03 LAB — BASIC METABOLIC PANEL
BUN: 21 mg/dL (ref 6–23)
Calcium: 7.7 mg/dL — ABNORMAL LOW (ref 8.4–10.5)
Creatinine, Ser: 1.01 mg/dL (ref 0.50–1.35)
GFR calc Af Amer: 81 mL/min — ABNORMAL LOW (ref >90)
GFR calc non Af Amer: 70 mL/min — ABNORMAL LOW (ref >90)
Potassium: 3.6 mEq/L (ref 3.5–5.1)

## 2012-05-03 LAB — CBC
HCT: 25 % — ABNORMAL LOW (ref 39.0–52.0)
MCH: 31 pg (ref 26.0–34.0)
MCHC: 33.2 g/dL (ref 30.0–36.0)
RDW: 15.7 % — ABNORMAL HIGH (ref 11.5–15.5)

## 2012-05-03 MED ORDER — INSULIN ASPART 100 UNIT/ML ~~LOC~~ SOLN
0.0000 [IU] | SUBCUTANEOUS | Status: AC
  Administered 2012-05-04: 2 [IU] via SUBCUTANEOUS

## 2012-05-03 MED ORDER — SIMVASTATIN 40 MG PO TABS
40.0000 mg | ORAL_TABLET | Freq: Every evening | ORAL | Status: DC
  Administered 2012-05-03 – 2012-05-06 (×4): 40 mg via ORAL
  Filled 2012-05-03 (×5): qty 1

## 2012-05-03 MED ORDER — GLIMEPIRIDE 2 MG PO TABS
2.0000 mg | ORAL_TABLET | Freq: Every day | ORAL | Status: DC
  Administered 2012-05-04 – 2012-05-05 (×2): 2 mg via ORAL
  Filled 2012-05-03 (×4): qty 1

## 2012-05-03 MED ORDER — TRAMADOL HCL 50 MG PO TABS
50.0000 mg | ORAL_TABLET | Freq: Four times a day (QID) | ORAL | Status: DC | PRN
  Administered 2012-05-03 – 2012-05-07 (×8): 50 mg via ORAL
  Filled 2012-05-03 (×8): qty 1

## 2012-05-03 MED ORDER — RAMIPRIL 10 MG PO CAPS: 10.0000 mg | ORAL_CAPSULE | Freq: Every day | ORAL | Status: DC

## 2012-05-03 MED ORDER — METFORMIN HCL 850 MG PO TABS
850.0000 mg | ORAL_TABLET | Freq: Two times a day (BID) | ORAL | Status: DC
  Administered 2012-05-03 – 2012-05-07 (×7): 850 mg via ORAL
  Filled 2012-05-03 (×10): qty 1

## 2012-05-03 MED ORDER — RAMIPRIL 5 MG PO CAPS
5.0000 mg | ORAL_CAPSULE | Freq: Two times a day (BID) | ORAL | Status: DC
  Administered 2012-05-03 – 2012-05-05 (×5): 5 mg via ORAL
  Filled 2012-05-03 (×8): qty 1

## 2012-05-03 MED ORDER — POTASSIUM CHLORIDE 10 MEQ/50ML IV SOLN
10.0000 meq | INTRAVENOUS | Status: AC
  Administered 2012-05-03 (×3): 10 meq via INTRAVENOUS
  Filled 2012-05-03 (×2): qty 50

## 2012-05-03 MED ORDER — POTASSIUM CHLORIDE 10 MEQ/50ML IV SOLN
10.0000 meq | INTRAVENOUS | Status: AC
  Administered 2012-05-03 – 2012-05-04 (×3): 10 meq via INTRAVENOUS
  Filled 2012-05-03: qty 100

## 2012-05-03 MED ORDER — INSULIN ASPART 100 UNIT/ML ~~LOC~~ SOLN: 0.0000 [IU] | SUBCUTANEOUS | Status: DC

## 2012-05-03 NOTE — Evaluation (Signed)
Physical Therapy Evaluation Patient Details Name: Christopher Walker MRN: [id number] DOB: 1933-02-10 Today's Date: 05/03/2012 Time: 1012-1028 PT Time Calculation (min): 16 min  PT Assessment / Plan / Recommendation Clinical Impression  Pt s/p AVR and CABG.  Expect pt will make steady progress and be able to return home with his wife.      PT Assessment  Patient needs continued PT services    Follow Up Recommendations  Home health PT;Supervision - Intermittent    Does the patient have the potential to tolerate intense rehabilitation      Barriers to Discharge        Equipment Recommendations  Rolling walker with 5" wheels    Recommendations for Other Services     Frequency Min 3X/week    Precautions / Restrictions Precautions Precautions: Sternal;Fall   Pertinent Vitals/Pain VSS. Pain 4/10. Pt premedicated.      Mobility  Transfers Transfers: Sit to Stand;Stand to Sit;Stand Pivot Transfers Sit to Stand: 1: +2 Total assist;Without upper extremity assist;From bed;From chair/3-in-1 Sit to Stand: Patient Percentage: 70% Stand to Sit: 1: +2 Total assist;Without upper extremity assist;To bed;To chair/3-in-1 Stand to Sit: Patient Percentage: 70% Stand Pivot Transfers: 1: +2 Total assist Stand Pivot Transfers: Patient Percentage: 70% Details for Transfer Assistance: Had pt place hands on knees with sit to stand to sit to limit use of arms.  Bed to [facility] with bil hand held assist. Ambulation/Gait Ambulation/Gait Assistance: 4: Min assist (+1 for lines) Ambulation Distance (Feet): 200 Feet Assistive device: Other (Comment) (pushing w/c) Ambulation/Gait Assistance Details: Multiple standing rest breaks. Gait Pattern: Step-through pattern;Decreased stride length;Trunk flexed;Wide base of support Gait velocity: decr    Shoulder Instructions     Exercises     PT Diagnosis: Difficulty walking;Acute pain;Generalized weakness  PT Problem List: Decreased strength;Decreased activity  tolerance;Decreased balance;Decreased mobility;Decreased knowledge of precautions;Decreased knowledge of use of DME;Pain PT Treatment Interventions: DME instruction;Gait training;Stair training;Functional mobility training;Patient/family education;Therapeutic activities;Therapeutic exercise   PT Goals Acute Rehab PT Goals PT Goal Formulation: With patient Time For Goal Achievement: 05/10/12 Potential to Achieve Goals: Good Pt will go Supine/Side to Sit: with supervision PT Goal: Supine/Side to Sit - Progress: Goal set today Pt will go Sit to Supine/Side: with supervision PT Goal: Sit to Supine/Side - Progress: Goal set today Pt will go Sit to Stand: with supervision PT Goal: Sit to Stand - Progress: Goal set today Pt will go Stand to Sit: with supervision PT Goal: Stand to Sit - Progress: Goal set today Pt will Ambulate: >150 feet;with supervision;with least restrictive assistive device PT Goal: Ambulate - Progress: Goal set today Pt will Go Up / Down Stairs: 3-5 stairs;with min assist;with rail(s) PT Goal: Up/Down Stairs - Progress: Goal set today  Visit Information  Last PT Received On: 05/03/12 Assistance Needed: +2    Subjective Data  Subjective: "I'm sorry I gave you a hard time earlier," pt apologizing for not amb earlier this AM. Patient Stated Goal: Return home   Prior Functioning  Home Living Lives With: Spouse Available Help at Discharge: Family;Available 24 hours/day Home Access: Stairs to enter Entrance Stairs-Number of Steps: 5 Entrance Stairs-Rails: Right Home Layout: Two level;Able to live on main level with bedroom/bathroom Bathroom Toilet: Standard Home Adaptive Equipment: Straight cane Prior Function Level of Independence: Independent Able to Take Stairs?: Yes Driving: Yes Vocation: Retired Musician: No difficulties    Cognition  Overall Cognitive Status: Appears within functional limits for tasks  assessed/performed Arousal/Alertness: Awake/alert Orientation Level: Appears intact for tasks  assessed Behavior During Session: [facility] for tasks performed    Extremity/Trunk Assessment Right Lower Extremity Assessment RLE ROM/Strength/Tone: Deficits RLE ROM/Strength/Tone Deficits: grossly 4/5 Left Lower Extremity Assessment LLE ROM/Strength/Tone: Deficits LLE ROM/Strength/Tone Deficits: grossly 4/5   Balance Static Standing Balance Static Standing - Balance Support: Right upper extremity supported Static Standing - Level of Assistance: 5: Stand by assistance  End of Session PT - End of Session Equipment Utilized During Treatment: Oxygen Activity Tolerance: Patient tolerated treatment well Patient left: with call bell/phone within reach (on [facility]) Nurse Communication: Mobility status  GP     [facility] 05/03/2012, 11:38 AM  Skip Mayer PT [phone]

## 2012-05-03 NOTE — Progress Notes (Signed)
Pt HR noted to have dropped to 46-49 bpm after P.M. dose of lopressor 25mg  PO given; epicardial pacing wires attached to external pacer and initiated at 70 bpm, 10mA with immediate capture noted; will cont' to monitor and assess; altace dose due at 0000 to be held for falling below ordered parameters.

## 2012-05-03 NOTE — Progress Notes (Signed)
K+= 3.6 and creat= 1.01 w/ urine o/p > 30cc/hr; TCTS KCL protocol initiated for KCL 44mEq/50cc IV x 3.

## 2012-05-03 NOTE — Progress Notes (Addendum)
   CARDIOTHORACIC SURGERY PROGRESS NOTE   R3 Days Post-Op Procedure(s) (LRB): CORONARY ARTERY BYPASS GRAFTING (CABG) (N/A) AORTIC VALVE REPLACEMENT (AVR) (N/A) MAZE (N/A)  Subjective: Looks much better.  Ambulated around SICU.  Appetite improving.  Objective: Vital signs: BP Readings from Last 1 Encounters:  05/03/12 105/57   Pulse Readings from Last 1 Encounters:  05/03/12 77   Resp Readings from Last 1 Encounters:  05/03/12 25   Temp Readings from Last 1 Encounters:  05/03/12 98.4 F (36.9 C) Oral    Hemodynamics:    Physical Exam:  Rhythm:   sinus  Breath sounds: clear  Heart sounds:  RRR  Incisions:  Clean and dry  Abdomen:  soft  Extremities:  warm   Intake/Output from previous day: 10/11 0701 - 10/12 0700 In: 2342 [P.O.:1260; I.V.:832; IV Piggyback:250] Out: 1948 [Urine:1595; Chest Tube:353] Intake/Output this shift: Total I/O In: 407 [P.O.:240; I.V.:117; IV Piggyback:50] Out: 345 [Urine:280; Chest Tube:65]  Lab Results:  [facility] 05/03/12 0338 05/02/12 0410  WBC 16.0* 17.7*  HGB 8.3* 8.9*  HCT 25.0* 26.7*  PLT 110* 111*   BMET:  Basename 05/03/12 0338 05/02/12 0410  NA 140 140  K 3.6 4.0  CL 104 105  CO2 28 26  GLUCOSE 128* 192*  BUN 21 18  CREATININE 1.01 1.00  CALCIUM 7.7* 8.0*    CBG (last 3)   Basename 05/03/12 1009 05/03/12 0859 05/03/12 0759  GLUCAP 158* 124* 115*   ABG    Component Value Date/Time   PHART 7.454* 05/01/2012 0825   HCO3 26.1* 05/01/2012 0825   TCO2 21 05/01/2012 1630   ACIDBASEDEF 1.0 04/30/2012 1501   O2SAT 94.0 05/01/2012 0825   CXR: *RADIOLOGY REPORT*  Clinical Data: Cardiac surgery  PORTABLE CHEST - 1 VIEW  Comparison: Yesterday  Findings: Right neck introducer sheath stable. One chest tube  removed with the other left in place. Cardiomegaly persists.  Vascular congestion improved. Bibasilar atelectasis unchanged.  Atrial appendage staple stable. Linear opacity at the right base  may represent  an additional chest tube. Mediastinum remains  widened.  IMPRESSION:  Stable bibasilar atelectasis. One chest tube removed without  ensuing pneumothorax.  Original Report Authenticated By: Donavan Burnet, M.D.    Assessment/Plan: S/P Procedure(s) (LRB): CORONARY ARTERY BYPASS GRAFTING (CABG) (N/A) AORTIC VALVE REPLACEMENT (AVR) (N/A) MAZE (N/A)  Overall doing well Expected post op acute blood loss anemia, mild, stable Expected post op volume excess, mild, diuresing Type II diabetes mellitus, excellent glycemic control but still on insulin drip   Mobilize  Continue lasix drip today  Leave foley in place due to diuresis  Restart ACE-I but at divided dosage for now  Keep pleural tubes until output decreased  Restart oral agents for diabetes, continue lantus insulin and wean insulin drip off  Coumadin   Edit Ricciardelli H 05/03/2012 10:39 AM

## 2012-05-03 NOTE — Progress Notes (Signed)
POD 3 and patient remains on insulin drip.  Patient's oral glycemic medications were started today, with continued lantus 32 units BID.  Dr. Cornelius Moras agreed that patient could transition off insulin drip since previous four CBGs have been in the 120 range, despite insulin gtt rate not being below 1.  Insulin transition orders will be placed per protocol.  Will continue to assess.  Keitha Butte, RN

## 2012-05-03 NOTE — Progress Notes (Signed)
TCTS BRIEF SICU PROGRESS NOTE  3 Days Post-Op  S/P Procedure(s) (LRB): CORONARY ARTERY BYPASS GRAFTING (CABG) (N/A) AORTIC VALVE REPLACEMENT (AVR) (N/A) MAZE (N/A)   Stable day Looks and feels better  Plan: Continue current plan  Purcell Nails 05/03/2012 6:35 PM

## 2012-05-04 ENCOUNTER — Inpatient Hospital Stay (HOSPITAL_COMMUNITY): Payer: Medicare Other

## 2012-05-04 LAB — BASIC METABOLIC PANEL
BUN: 23 mg/dL (ref 6–23)
Calcium: 7.7 mg/dL — ABNORMAL LOW (ref 8.4–10.5)
GFR calc non Af Amer: 68 mL/min — ABNORMAL LOW (ref >90)
Glucose, Bld: 133 mg/dL — ABNORMAL HIGH (ref 70–99)

## 2012-05-04 LAB — CBC
HCT: 24 % — ABNORMAL LOW (ref 39.0–52.0)
Hemoglobin: 7.9 g/dL — ABNORMAL LOW (ref 13.0–17.0)
MCH: 31.1 pg (ref 26.0–34.0)
MCHC: 32.9 g/dL (ref 30.0–36.0)

## 2012-05-04 LAB — GLUCOSE, CAPILLARY: Glucose-Capillary: 116 mg/dL — ABNORMAL HIGH (ref 70–99)

## 2012-05-04 MED ORDER — INSULIN ASPART 100 UNIT/ML ~~LOC~~ SOLN
0.0000 [IU] | Freq: Three times a day (TID) | SUBCUTANEOUS | Status: DC
  Administered 2012-05-04 – 2012-05-06 (×2): 4 [IU] via SUBCUTANEOUS
  Administered 2012-05-07: 2 [IU] via SUBCUTANEOUS

## 2012-05-04 MED ORDER — SODIUM CHLORIDE 0.9 % IJ SOLN
3.0000 mL | Freq: Two times a day (BID) | INTRAMUSCULAR | Status: DC
  Administered 2012-05-04 – 2012-05-07 (×7): 3 mL via INTRAVENOUS

## 2012-05-04 MED ORDER — FUROSEMIDE 10 MG/ML IJ SOLN
40.0000 mg | Freq: Once | INTRAMUSCULAR | Status: AC
  Administered 2012-05-04: 40 mg via INTRAVENOUS

## 2012-05-04 MED ORDER — SODIUM CHLORIDE 0.9 % IV SOLN: 250.0000 mL | INTRAVENOUS | Status: DC | PRN

## 2012-05-04 MED ORDER — SODIUM CHLORIDE 0.9 % IJ SOLN: 3.0000 mL | INTRAMUSCULAR | Status: DC | PRN

## 2012-05-04 MED ORDER — POTASSIUM CHLORIDE 10 MEQ/50ML IV SOLN
10.0000 meq | INTRAVENOUS | Status: DC | PRN
  Administered 2012-05-04 (×2): 10 meq via INTRAVENOUS
  Filled 2012-05-04 (×2): qty 50

## 2012-05-04 MED ORDER — POTASSIUM CHLORIDE 10 MEQ/50ML IV SOLN
10.0000 meq | INTRAVENOUS | Status: AC
  Administered 2012-05-04 (×3): 10 meq via INTRAVENOUS
  Filled 2012-05-04: qty 150

## 2012-05-04 MED ORDER — POTASSIUM CHLORIDE 10 MEQ/50ML IV SOLN
INTRAVENOUS | Status: AC
  Administered 2012-05-04: 10 meq via INTRAVENOUS
  Filled 2012-05-04: qty 50

## 2012-05-04 MED ORDER — POTASSIUM CHLORIDE CRYS ER 20 MEQ PO TBCR
20.0000 meq | EXTENDED_RELEASE_TABLET | Freq: Two times a day (BID) | ORAL | Status: DC
  Administered 2012-05-05 (×2): 20 meq via ORAL
  Filled 2012-05-04 (×6): qty 1

## 2012-05-04 MED ORDER — MOVING RIGHT ALONG BOOK
Freq: Once | Status: AC
  Administered 2012-05-04: 14:00:00
  Filled 2012-05-04: qty 1

## 2012-05-04 MED ORDER — FUROSEMIDE 80 MG PO TABS
80.0000 mg | ORAL_TABLET | Freq: Two times a day (BID) | ORAL | Status: DC
  Administered 2012-05-05: 80 mg via ORAL
  Filled 2012-05-04 (×3): qty 1

## 2012-05-04 NOTE — Progress Notes (Signed)
   CARDIOTHORACIC SURGERY PROGRESS NOTE   R4 Days Post-Op Procedure(s) (LRB): CORONARY ARTERY BYPASS GRAFTING (CABG) (N/A) AORTIC VALVE REPLACEMENT (AVR) (N/A) MAZE (N/A)  Subjective: No complaints.  Feels better.  Mild soreness in chest with cough.  Ambulated 300 feet this am.  Objective: Vital signs: BP Readings from Last 1 Encounters:  05/04/12 115/59   Pulse Readings from Last 1 Encounters:  05/04/12 82   Resp Readings from Last 1 Encounters:  05/04/12 23   Temp Readings from Last 1 Encounters:  05/04/12 97.5 F (36.4 C) Oral    Hemodynamics:    Physical Exam:  Rhythm:   sinus  Breath sounds: clear  Heart sounds:  RRR  Incisions:  Clean and dry  Abdomen:  soft  Extremities:  Warm, swollen   Intake/Output from previous day: 10/12 0701 - 10/13 0700 In: 2262.4 [P.O.:1240; I.V.:772.4; IV Piggyback:250] Out: 2140 [Urine:1805; Chest Tube:335] Intake/Output this shift: Total I/O In: 500 [P.O.:360; I.V.:90; IV Piggyback:50] Out: 535 [Urine:475; Chest Tube:60]  Lab Results:  [facility] 05/04/12 0334 05/03/12 0338  WBC 11.6* 16.0*  HGB 7.9* 8.3*  HCT 24.0* 25.0*  PLT 131* 110*   BMET:  Basename 05/04/12 0334 05/03/12 0338  NA 137 140  K 3.5 3.6  CL 101 104  CO2 29 28  GLUCOSE 133* 128*  BUN 23 21  CREATININE 1.03 1.01  CALCIUM 7.7* 7.7*    CBG (last 3)   Basename 05/04/12 0745 05/04/12 0354 05/04/12 0008  GLUCAP 116* 120* 159*   ABG    Component Value Date/Time   PHART 7.454* 05/01/2012 0825   HCO3 26.1* 05/01/2012 0825   TCO2 21 05/01/2012 1630   ACIDBASEDEF 1.0 04/30/2012 1501   O2SAT 94.0 05/01/2012 0825   CXR: *RADIOLOGY REPORT*  Clinical Data: Chest tubes in place  PORTABLE CHEST - 1 VIEW  Comparison: 05/03/2012  Findings: Right internal jugular vein introducer sheath stable.  Bibasilar subsegmental atelectasis and low lung volumes are  stable. Cardiomegaly. Pulmonary vascularity within normal limits.  No pneumothorax. Stable chest  tube and right base stable left  basilar chest tube.  IMPRESSION:  Stable bibasilar atelectasis. Cardiomegaly without edema. No  pneumothorax.  Original Report Authenticated By: Donavan Burnet, M.D.   Assessment/Plan: S/P Procedure(s) (LRB): CORONARY ARTERY BYPASS GRAFTING (CABG) (N/A) AORTIC VALVE REPLACEMENT (AVR) (N/A) MAZE (N/A)  Overall doing well POD4 Expected post op acute blood loss anemia, somewhat worse Expected post op volume excess, mild, diuresing Type II diabetes mellitus, excellent glycemic control, off insulin drip Baseline mild neuromuscular disorder   Transfuse x 1 unit PRBC's  Mobilize  Diuresis  D/C pleural tubes  Transfer step down  Coumadin  Jonnie Truxillo H 05/04/2012 10:23 AM

## 2012-05-04 NOTE — Progress Notes (Signed)
K+= 3.5 and creat= 1.03 w/ urine o/p > 30cc/hr; TCTS KCL protocol initiated with KCL/ 50cc IV x 3.

## 2012-05-04 NOTE — Progress Notes (Signed)
Pt transfer from unit 2300 to unit 2000 after giving report to receiving RN. Chart, meds, and patient belongings all sent with patient. Family present during transfer.  [place], [place]

## 2012-05-05 ENCOUNTER — Inpatient Hospital Stay (HOSPITAL_COMMUNITY): Payer: Medicare Other

## 2012-05-05 LAB — CBC
HCT: 25.8 % — ABNORMAL LOW (ref 39.0–52.0)
Hemoglobin: 8.4 g/dL — ABNORMAL LOW (ref 13.0–17.0)
MCHC: 32.6 g/dL (ref 30.0–36.0)
MCV: 93.5 fL (ref 78.0–100.0)
RDW: 15.8 % — ABNORMAL HIGH (ref 11.5–15.5)

## 2012-05-05 LAB — BASIC METABOLIC PANEL
BUN: 24 mg/dL — ABNORMAL HIGH (ref 6–23)
Creatinine, Ser: 0.96 mg/dL (ref 0.50–1.35)
GFR calc Af Amer: >90 mL/min (ref >90)
GFR calc non Af Amer: 78 mL/min — ABNORMAL LOW (ref >90)
Glucose, Bld: 88 mg/dL (ref 70–99)

## 2012-05-05 LAB — TYPE AND SCREEN
Antibody Screen: NEGATIVE
Unit division: 0

## 2012-05-05 LAB — GLUCOSE, CAPILLARY
Glucose-Capillary: 82 mg/dL (ref 70–99)
Glucose-Capillary: 96 mg/dL (ref 70–99)
Glucose-Capillary: 77 mg/dL (ref 70–99)
Glucose-Capillary: 75 mg/dL (ref 70–99)

## 2012-05-05 MED ORDER — WARFARIN SODIUM 5 MG PO TABS
5.0000 mg | ORAL_TABLET | Freq: Every day | ORAL | Status: DC
  Administered 2012-05-05 – 2012-05-06 (×2): 5 mg via ORAL
  Filled 2012-05-05 (×3): qty 1

## 2012-05-05 MED ORDER — FUROSEMIDE 80 MG PO TABS
80.0000 mg | ORAL_TABLET | Freq: Two times a day (BID) | ORAL | Status: DC
  Filled 2012-05-05 (×3): qty 1

## 2012-05-05 MED ORDER — ASPIRIN 81 MG PO TBEC: 81.0000 mg | DELAYED_RELEASE_TABLET | Freq: Every day | ORAL | Status: AC

## 2012-05-05 MED ORDER — LACTULOSE 10 GM/15ML PO SOLN
20.0000 g | Freq: Once | ORAL | Status: DC
Start: 2012-05-05 — End: 2012-05-07
  Filled 2012-05-05: qty 30

## 2012-05-05 MED ORDER — POTASSIUM CHLORIDE CRYS ER 20 MEQ PO TBCR
40.0000 meq | EXTENDED_RELEASE_TABLET | Freq: Once | ORAL | Status: AC
  Administered 2012-05-05: 40 meq via ORAL

## 2012-05-05 MED ORDER — WARFARIN VIDEO: Freq: Once | Status: DC

## 2012-05-05 MED ORDER — POTASSIUM CHLORIDE CRYS ER 20 MEQ PO TBCR: 20.0000 meq | EXTENDED_RELEASE_TABLET | Freq: Every day | ORAL | Status: DC

## 2012-05-05 MED ORDER — METOPROLOL TARTRATE 25 MG PO TABS: 25.0000 mg | ORAL_TABLET | Freq: Two times a day (BID) | ORAL | Status: DC

## 2012-05-05 MED ORDER — COUMADIN BOOK
Freq: Once | Status: AC
  Administered 2012-05-05: 16:00:00
  Filled 2012-05-05 (×2): qty 1

## 2012-05-05 MED ORDER — FUROSEMIDE 10 MG/ML IJ SOLN
80.0000 mg | Freq: Two times a day (BID) | INTRAMUSCULAR | Status: DC
  Administered 2012-05-05: 80 mg via INTRAVENOUS
  Filled 2012-05-05: qty 8

## 2012-05-05 MED ORDER — TRAMADOL-ACETAMINOPHEN 37.5-325 MG PO TABS: 1.0000 | ORAL_TABLET | Freq: Four times a day (QID) | ORAL | Status: AC | PRN

## 2012-05-05 MED ORDER — WARFARIN SODIUM 5 MG PO TABS: 5.0000 mg | ORAL_TABLET | Freq: Every day | ORAL | Status: AC

## 2012-05-05 MED ORDER — FUROSEMIDE 40 MG PO TABS: 40.0000 mg | ORAL_TABLET | Freq: Every day | ORAL | Status: DC

## 2012-05-05 MED FILL — Heparin Sodium (Porcine) Inj 1000 Unit/ML: INTRAMUSCULAR | Qty: 30 | Status: AC

## 2012-05-05 MED FILL — Sodium Chloride IV Soln 0.9%: INTRAVENOUS | Qty: 1000 | Status: AC

## 2012-05-05 MED FILL — Electrolyte-R (PH 7.4) Solution: INTRAVENOUS | Qty: 5000 | Status: AC

## 2012-05-05 MED FILL — Sodium Bicarbonate IV Soln 8.4%: INTRAVENOUS | Qty: 50 | Status: AC

## 2012-05-05 MED FILL — Lidocaine HCl IV Inj 20 MG/ML: INTRAVENOUS | Qty: 5 | Status: AC

## 2012-05-05 MED FILL — Mannitol IV Soln 20%: INTRAVENOUS | Qty: 500 | Status: AC

## 2012-05-05 MED FILL — Heparin Sodium (Porcine) Inj 1000 Unit/ML: INTRAMUSCULAR | Qty: 20 | Status: AC

## 2012-05-05 MED FILL — Sodium Chloride Irrigation Soln 0.9%: Qty: 3000 | Status: AC

## 2012-05-05 NOTE — Discharge Summary (Addendum)
Physician Discharge Summary  Patient ID: Christopher Walker MRN: [id number] DOB/AGE: 01/05/1933 77 y.o.  Admit date: 04/30/2012 Discharge date: 05/07/2012  Admission Diagnoses: 1.Severe aortic stenosis 2.History of CAD 3.History of non ischemic cardiomyopathy 4.History of CHF 5.History of atrial fibrillation (s/p cardioversion 2013) 6.History of DM 7. History of hypertension 8.History of tobacco abuse 9.History of GERD  Discharge Diagnoses:  1.Severe aortic stenosis 2.History of CAD 3.History of non ischemic cardiomyopathy 4.History of CHF 5.History of atrial fibrillation (s/p cardioversion 2013) 6.History of DM 7. History of hypertension 8.History of tobacco abuse 9.History of GERD 10.ABL anemia  Procedure (s):  Aortic Valve Replacement Edwards Magna Ease Pericardial Tissue Valve (size 25mm, model # 3300TFX, serial # [id number])  Coronary Artery Bypass Grafting x 3  Left Internal Mammary Artery to Distal Left Anterior Descending Coronary Artery  Saphenous Vein Graft to First Obtuse Marginal Branch of Left Circumflex Coronary Artery  Saphenous Vein Graft to Second Obtuse Marginal Branch of Left Circumflex Coronary Artery  Endoscopic Vein Harvest from Right Thigh and Lower Leg  Maze Procedure Left side lesion set using bipolar radiofrequency and cryothermy ablation  Clipping of left atrial appendage by Dr. Cornelius Moras on 04/30/2012  History of Presenting Illness: This is a 78 year old Caucasian male from [place] with aortic stenosis, persistent atrial fibrillation, congestive heart failure, hypertension, type 2 diabetes mellitus, and degenerative arthritis. The patient describes a several year gradual decline in his overall functional status that seems to be multifactorial. He has developed progressive exertional shortness of breath as well as generalized weakness. He initially presented in early May of this year to see Dr. Darrold Junker in [place] where he was noted to be in congestive  heart failure. He was admitted to the hospital at [facility] during which time he had an echocardiogram and left and right heart catheterization. He was noted to have dilated nonischemic cardiomyopathy with ejection fraction estimated 24% by catheterization. At the time, his aortic stenosis was felt not to be significant. Although full details of that hospitalization are not currently available, he apparently improved symptomatically with diuresis of more than 25 pounds off his baseline weight. He was later referred to Dr. Graciela Husbands for possible placement of a defibrillator because of his underlying cardiomyopathy. A follow up echocardiogram was performed that revealed some improvement of his ejection fraction to 40-45% and what appeared to be severe aortic stenosis with peak and mean transvalvular gradients of 51 and 29 mmHg, respectively with calculated valve area estimated to be 0.8 cm2. He was referred to the multidisciplinary heart valve clinic to discuss possible treatment options for management of severe symptomatic aortic stenosis.       The patient describes a gradual progression of symptoms of exertional shortness of breath. He denies resting shortness of breath, PND, and orthopnea. He has had some problems with lower extremity edema, but this has been improved on medical therapy. He has never had any chest pain or chest tightness either with activity or at rest. He has not had any dizzy spells nor syncope. From a functional standpoint the patient is also limited to generalized weakness. The patient has chronic pain in his lower back left hip and leg which limits him some, and he also reports having some difficulty getting up from a chair to stand due to weakness in his legs. However, he states that with ambulation his primary limitation is shortness of breath. He also has complaints of worsening intention tremor and generalized weakness. He states that beyond this just doesn't feel  like  doing much of anything any longer and he doesn't know why.       He was originally seen in the multidisciplinary heart valve clinic on 03/14/2012. Since then he underwent DC cardioversion by Dr. Darrold Junker. Patient returns to the multidisciplinary heart valve clinic last month and underwent followup echocardiogram which revealed normalization of LV systolic function. He reported that since cardioversion he doesn't really feel any better than he did prior to cardioversion. His primary complaint remains that he gives out and get short of breath with physical activity. He has not had resting shortness of breath. He has not had any chest discomfort but he does note that occasionally he gets dull aching pain radiating up into his jaw. He states that this occurs primarily if he is under stress or aggravated, and episodes typically only last for a few minutes and all much longer. He has not had any dizzy spells or syncope. He denies any PND, orthopnea, or lower extremity edema. The remainder of his review of systems is unchanged from previously.      He was evalutated by Dr. Cornelius Moras for the consideration of coronary artery bypass grafting surgery, AVR, and Maze procedure.  Potential risks, complications, and benefits were discussed with the patient and he agreed to proceed. Pre op carotid duplex US showed no significant RICA stenosis and a 60-79% LICA stenosis. ABI's were 0.69 on the right and greater than 1 on the left. He was admitted to [facility] on 04/30/2012 in order to undergo a CABG x 3, AVR, and Maze.  Brief Hospital Course:  He was extubated successfully the morning of post op day one. He remained afebrile and hemodynamically stable. His Swan [place], a line, and mediastinal chest tube were all removed early in his post op course. Foley remained in a few days as he was aggressively being diuresed. Also, His pleural tubes were removed on post op day 4.He was weaned off his insulin gttp and restarted on his oral  glycemics with good glucose control. He did have mild ABL anemia. He did require a post op transfusion.His last H and H was 8.4 and 25.8. He was weaned off dopamine and started on low dose Lopressor. He was volume overloaded and initially diuresed with a Lasix gttp. He remained on oral Lasix post op as he continued to have volume overload. He was then restarted on Ramipril for better blood pressure control. He continued to progress with cardiac rehab. He was felt surgically stable for transfer from the ICU to [facility] for further convalescence on 05/04/2012.He has been tolerating a diet and has had a bowel movement. His INR was remaining 1.17 so his Coumadin was increased. He continued to maintain sinus rhythm. He did require further diuresis with IV lasix.He has had very good diuresis over the next couple of days. He did become tachycardic and had a HR into the 140's briefly yesterday. As a result, his Lopressor was increased to 50 po bid (he was on Toprol XL 100 daily prior to surgery).He has been seen and evaluated by Dr. Cornelius Moras this morning. He will be discharged home today.  Latest Vital Signs: Blood pressure 121/73, pulse 94, temperature 98.3 F (36.8 C), temperature source Oral, resp. rate 19, height 6' (1.829 m), weight 267 lb 6.7 oz (121.3 kg), SpO2 97.00%.  Physical Exam: Cardiovascular: RRR, no murmurs  Pulmonary: Diminished at bases bilaterally;some crackles  Abdomen: Soft, non tender, bowel sounds present.  Extremities: 2+ bilateral lower extremity edema.Ecchymosis of right thigh.  Wounds: Clean and dry. No erythema or signs of infection.   Discharge Condition:Stable  Recent laboratory studies:  Lab Results  Component Value Date   WBC 10.3 05/05/2012   HGB 8.4* 05/05/2012   HCT 25.8* 05/05/2012   MCV 93.5 05/05/2012   PLT 167 05/05/2012   Lab Results  Component Value Date   NA 139 05/05/2012   K 3.9 05/05/2012   CL 102 05/05/2012   CO2 30 05/05/2012   CREATININE 0.96 05/05/2012     GLUCOSE 88 05/05/2012      Diagnostic Studies: Dg Chest 2 View  05/05/2012  *RADIOLOGY REPORT*  Clinical Data: Post CABG, evaluate for CHF  CHEST - 2 VIEW  Comparison: 05/04/2012; 05/03/2012; 05/02/2012; 04/28/2012  Findings: Grossly unchanged enlarged cardiac silhouette and mediastinal contours post median sternotomy, CABG and aortic valve repair.  Interval removal of right jugular approach central venous catheter. Increased conspicuity of likely unchanged tiny right apical pneumothorax.  Pulmonary vasculature remains indistinct with cephalization of flow.  Minimally improved aeration of the lung bases with persistent bibasilar heterogeneous opacities. Unchanged small bilateral effusions.  Vascular calcifications overlie the upper abdomen.  Unchanged bones.  IMPRESSION: 1.  Increased conspicuity of likely unchanged tiny right apical pneumothorax. 2.  Mild pulmonary edema and small bilateral effusions. 3.  Decreased bibasilar opacities, likely atelectasis.   Original Report Authenticated By: Waynard Reeds, M.D.     Ct Chest Wo Contrast  04/28/2012  *RADIOLOGY REPORT*  Clinical Data: Preop aortic valve surgery.  Evaluate ascending aorta.  CT CHEST WITHOUT CONTRAST  Technique:  Multidetector CT imaging of the chest was performed following the standard protocol without IV contrast.  Comparison: None.  Findings: No pathologically enlarged mediastinal or axillary lymph nodes.  Hilar regions are difficult to definitively evaluate without IV contrast.  There are aortic valvular calcifications. Ascending aorta measures up to 3.8 cm.  Transverse descending thoracic aorta are normal in caliber.  Extensive coronary artery calcification.  Heart is at the upper limits of normal in size.  No pericardial effusion. Pulmonary arteries are enlarged.  Tiny hiatal hernia.  There is very mild subpleural reticulation bilaterally.  Right lower lobe nodule measures 9 x 6 mm.  Additional scattered small subpleural nodular  densities are noted.  No pleural fluid.  Airway is unremarkable.  Incidental imaging of the upper abdomen shows a 2.0 x 2.3 cm low attenuation nodule in the right adrenal gland, measuring -6 Hounsfield units.  Duodenal diverticulum is incidentally noted.  A fair amount of stool is seen in the visualized colon.  No worrisome lytic or sclerotic lesions.  Degenerative changes are seen in the spine.  IMPRESSION:  1.  Aortic valvular calcifications without ascending aortic aneurysm 2.  Extensive coronary artery calcification and pulmonary arterial hypertension. 3.  Mild subpleural pulmonary fibrosis. 4.  Right lower lobe nodule. If the patient is at high risk for bronchogenic carcinoma, follow-up chest CT at 3-6 months is recommended.  If the patient is at low risk for bronchogenic carcinoma, follow-up chest CT at 6-12 months is recommended.  This recommendation follows the consensus statement: Guidelines for Management of Small Pulmonary Nodules Detected on CT Scans: A Statement from the Fleischner Society as published in Radiology 2005; 237:395-400.   Original Report Authenticated By: Reyes Ivan, M.D.     Discharge Orders    Future Appointments: Provider: Department: Dept Phone: Center:   05/06/2012 2:30 PM Duke Salvia, MD [facility] [phone] [facility]   05/26/2012 1:00 PM [facility] [phone] [facility]      Discharge Medications:   Medication List     As of 05/06/2012 12:55 PM    STOP taking these medications         apixaban 5 MG Tabs tablet   Commonly known as: ELIQUIS      digoxin 0.125 MG tablet   Commonly known as: LANOXIN      metoprolol succinate 100 MG 24 hr tablet   Commonly known as: TOPROL-XL      TAKE these medications         aspirin 81 MG EC tablet   Take 1 tablet (81 mg total) by mouth daily.      calcium-vitamin D 500-200 MG-UNIT per tablet   Commonly known as: OSCAL WITH D   Take 1.5 tablets by mouth 2 (two) times  daily.      furosemide 80 MG tablet   Commonly known as: LASIX   Take 1 tablet (80 mg total) by mouth 2 (two) times daily. For one week;then take Lasix 80 mg po daily thereafter      glimepiride 2 MG tablet   Commonly known as: AMARYL   Take 2 mg by mouth daily before breakfast.      metFORMIN 850 MG tablet   Commonly known as: GLUCOPHAGE   Take 850 mg by mouth 2 (two) times daily with a meal.      metoprolol tartrate 50 MG tablet   Commonly known as: LOPRESSOR   Take 1 tablet (50 mg total) by mouth 2 (two) times daily.      potassium chloride SA 20 MEQ tablet   Commonly known as: K-DUR,KLOR-CON   Take 2 tablets (40 mEq total) by mouth 2 (two) times daily. For one week;then take Potassium chloride 40 meq po daily thereafter      ramipril 10 MG capsule   Commonly known as: ALTACE   Take 10 mg by mouth daily.      simvastatin 40 MG tablet   Commonly known as: ZOCOR   Take 40 mg by mouth every evening.      traMADol-acetaminophen 37.5-325 MG per tablet   Commonly known as: ULTRACET   Take 1 tablet by mouth every 6 (six) hours as needed for pain. For pain.      warfarin 5 MG tablet   Commonly known as: COUMADIN   Take 1 tablet (5 mg total) by mouth daily at 6 PM.      zinc gluconate 50 MG tablet   Take 50 mg by mouth daily.           The patient has been discharged on:   1.Beta Blocker:  Yes [  x ]                              No   [   ]                              If No, reason:  2.Ace Inhibitor/ARB: Yes [  x ]                                     No  [    ]  If No, reason:  3.Statin:   Yes [  x ]                  No  [   ]                  If No, reason:  4.Ecasa:  Yes  [  x ]                  No   [   ]                  If No, reason:   Follow Up Appointments:     Follow-up Information    Schedule an appointment as soon as possible for a visit with PARASCHOS,ALEXANDER, MD. (for one week)    Contact information:    [address] [place] [address] [phone]       Schedule an appointment as soon as possible for a visit with PARASCHOS,ALEXANDER, MD. (to have PT and INR drawn on Friday 05/09/2012)    Contact information:   [address] [place] [address] [phone]       Follow up with Purcell Nails, MD. (PA/LAT CXR to be taken (at [facility] which is in the same building as Dr. Orvan July office) on 05/26/2012 at 12:00 pm ;Appointment with Dr. Orvan July physician assistant is on 05/26/2012 at 1:00 pm)    Contact information:   [address] [employer] [address] [place] [address] [phone]     Signed: Doree Fudge MPA-C 05/05/2012, 9:30 AM

## 2012-05-05 NOTE — Progress Notes (Signed)
Physical Therapy Treatment Patient Details Name: Christopher Walker MRN: [id number] DOB: [birth year] Today's Date: 05/05/2012 Time: 1191-4782 PT Time Calculation (min): 26 min  PT Assessment / Plan / Recommendation Comments on Treatment Session  Pt s/p AVR and CABG.  Pt progressing well and should be able to go home with wife.    Follow Up Recommendations  Home health PT;Supervision - Intermittent     Does the patient have the potential to tolerate intense rehabilitation     Barriers to Discharge        Equipment Recommendations  Other (comment) (May need rollator instead of rolling walker)    Recommendations for Other Services    Frequency Min 3X/week   Plan Discharge plan remains appropriate;Frequency remains appropriate    Precautions / Restrictions Precautions Precautions: Sternal;Fall Restrictions Weight Bearing Restrictions: No   Pertinent Vitals/Pain SaO2 90% on RA    Mobility  Transfers Sit to Stand: 4: Min assist;With upper extremity assist;From chair/3-in-1 Stand to Sit: To toilet;4: Min assist;With upper extremity assist;To chair/3-in-1 Details for Transfer Assistance: Pt using rocking momentum to get up to minimize use of hands. Ambulation/Gait Ambulation/Gait Assistance: 5: Supervision Ambulation Distance (Feet): 350 Feet Assistive device: Rolling walker Ambulation/Gait Assistance Details: verbal cues to stay closer to walker Gait Pattern: Wide base of support;Step-through pattern;Decreased stride length;Trunk flexed    Exercises     PT Diagnosis:    PT Problem List:   PT Treatment Interventions:     PT Goals Acute Rehab PT Goals PT Goal: Sit to Stand - Progress: Progressing toward goal PT Goal: Stand to Sit - Progress: Progressing toward goal Pt will Ambulate: >150 feet;with modified independence PT Goal: Ambulate - Progress: Updated due to goal met  Visit Information  Last PT Received On: 05/05/12 Assistance Needed: +1    Subjective Data  Subjective: Pt requesting to go the bathroom.   Cognition  Overall Cognitive Status: Appears within functional limits for tasks assessed/performed Arousal/Alertness: Awake/alert Orientation Level: Appears intact for tasks assessed Behavior During Session: [facility] for tasks performed    Balance  Static Standing Balance Static Standing - Balance Support: No upper extremity supported;During functional activity Static Standing - Level of Assistance: 5: Stand by assistance  End of Session PT - End of Session Activity Tolerance: Patient tolerated treatment well Patient left: in bed;with bed alarm set;with call bell/phone within reach (sitting EOB) Nurse Communication: Mobility status   GP     [facility] 05/05/2012, 9:43 AM  [facility] PT [phone]

## 2012-05-05 NOTE — Progress Notes (Signed)
Pt reports nausea has improved; will cont. To monitor. 

## 2012-05-05 NOTE — Progress Notes (Addendum)
[address]            [place] [address]          [phone]      5 Days Post-Op Procedure(s) (LRB): CORONARY ARTERY BYPASS GRAFTING (CABG) (N/A) AORTIC VALVE REPLACEMENT (AVR) (N/A) MAZE (N/A)  Subjective: Patient without complaints and is eager to go home.  Objective: Vital signs in last 24 hours: Patient Vitals for the past 24 hrs:  BP Temp Temp src Pulse Resp SpO2 Weight  05/05/12 0441 121/73 mmHg 98.3 F (36.8 C) Oral 77  19  97 % 267 lb 6.7 oz (121.3 kg)  05/04/12 2112 119/67 mmHg 98.7 F (37.1 C) Oral 81  20  98 % -  05/04/12 1458 115/64 mmHg 97.7 F (36.5 C) Oral 81  18  98 % -  05/04/12 1415 116/73 mmHg 97.7 F (36.5 C) Oral 76  19  - -  05/04/12 1400 116/73 mmHg 97.7 F (36.5 C) Oral 77  19  97 % -  05/04/12 1300 124/67 mmHg - - 75  21  97 % -  05/04/12 1245 108/64 mmHg 97.7 F (36.5 C) Oral 72  23  - -  05/04/12 1230 127/60 mmHg 97.9 F (36.6 C) Oral 73  20  - -  05/04/12 1200 127/60 mmHg - - 79  22  94 % -  05/04/12 1149 - 97.7 F (36.5 C) Oral - - - -  05/04/12 1100 115/71 mmHg - - 74  21  98 % -  05/04/12 1000 115/59 mmHg - - 82  23  99 % -  05/04/12 0900 129/64 mmHg - - 81  24  96 % -  05/04/12 0800 107/62 mmHg - - 96  27  100 % -  05/04/12 0746 - 97.5 F (36.4 C) Oral - - - -   Pre op weight  116.1 kg Current Weight  05/05/12 267 lb 6.7 oz (121.3 kg)      Intake/Output from previous day: 10/13 0701 - 10/14 0700 In: 1181.5 [P.O.:480; I.V.:147.5; Blood:350; IV Piggyback:204] Out: 1410 [Urine:1300; Chest Tube:110]   Physical Exam:  Cardiovascular: RRR, no murmurs Pulmonary: Diminished at bases bilaterally;some crackles Abdomen: Soft, non tender, bowel sounds present. Extremities: 2-3+ bilateral lower extremity edema.Ecchymosis of right thigh. Wounds: Clean and dry.  No erythema or signs of infection.  Lab Results: CBC: Basename 05/05/12 0510 05/04/12 0334  WBC 10.3 11.6*  HGB 8.4* 7.9*  HCT 25.8*  24.0*  PLT 167 131*   BMET:  Basename 05/05/12 0510 05/04/12 0334  NA 139 137  K 3.9 3.5  CL 102 101  CO2 30 29  GLUCOSE 88 133*  BUN 24* 23  CREATININE 0.96 1.03  CALCIUM 8.0* 7.7*    PT/INR:  Lab Results  Component Value Date   INR 1.17 05/05/2012   INR 1.17 05/04/2012   INR 1.22 05/03/2012   ABG:  INR: Will add last result for INR, ABG once components are confirmed Will add last 4 CBG results once components are confirmed  Assessment/Plan:  1. CV - SR.Continue Lopressor 25 bid, Ramipril 5 daily, and Coumadin. May need to increase Coumadin as INR remaining 1.17. 2.  Pulmonary - Encourage incentive spiromete and flutter valve.CXR this am shows some improvement in aeration on the left, bibasilar atelectasis, cardiomegaly, and no ptx. 3. Volume Overload - On Lasix 80 bid 4.  Acute  blood loss anemia - H and H increased to 8.4 and 25.8  after 1 unit PRBCs yesterday. 5.DM- CBGs 179/100/82.Pre op HGA1C 7.3. Continue Metformin 850 bid, Glimiperide 2 daily, and will stop scheduled insulin. 6.Remove EPW and CT sutures in am 7.Hopefully home in 1-2 days  ZIMMERMAN,DONIELLE MPA-C 05/05/2012,7:18 AM   I have seen and examined the patient and agree with the assessment and plan as outlined.  Still with significant volume overload.  Will give lasix IV today.  Increase coumadin 5 mg daily.  Anticipate d/c 2-3 days.  Talisa Petrak H 05/05/2012 9:04 AM

## 2012-05-05 NOTE — Progress Notes (Signed)
BS 67 at this time; supper tray in room; pt encouraged to eat; pt asymptomatic at this time; will cont. To monitor.

## 2012-05-05 NOTE — Evaluation (Signed)
Occupational Therapy Evaluation Patient Details Name: Christopher Walker MRN: [id number] DOB: [birth year] Today's Date: 05/05/2012 Time: 9528-4132 OT Time Calculation (min): 35 min  OT Assessment / Plan / Recommendation Clinical Impression  This 78 y.o. male admitted for AVR and CABG.  Pt. currently is able to perform BADLs with supervision.  He demonstrates good awareness of sternal precautions, has all needed DME, and wife is very supportive.  No formal OT needs identified.  Encouraged pt to continue to ambulate with cardiac rehab.    OT Assessment  Patient does not need any further OT services    Follow Up Recommendations  No OT follow up;Supervision/Assistance - 24 hour    Barriers to Discharge      Equipment Recommendations  None recommended by OT    Recommendations for Other Services    Frequency       Precautions / Restrictions Precautions Precautions: Sternal;Fall Restrictions Weight Bearing Restrictions: No       ADL  Eating/Feeding: Simulated;Independent Where Assessed - Eating/Feeding: Chair Grooming: Simulated;Wash/dry hands;Wash/dry face;Teeth care;Supervision/safety Where Assessed - Grooming: Supported standing Upper Body Bathing: Simulated;Supervision/safety Where Assessed - Upper Body Bathing: Unsupported sitting Lower Body Bathing: Simulated;Supervision/safety Where Assessed - Lower Body Bathing: Unsupported sit to stand Upper Body Dressing: Simulated;Supervision/safety Where Assessed - Upper Body Dressing: Unsupported sitting Lower Body Dressing: Performed;Supervision/safety Where Assessed - Lower Body Dressing: Unsupported sit to stand Toilet Transfer: Simulated;Supervision/safety Toilet Transfer Method: Sit to Barista: Comfort height toilet Toileting - Clothing Manipulation and Hygiene: Simulated;Supervision/safety Where Assessed - Toileting Clothing Manipulation and Hygiene: Sit to stand from 3-in-1 or toilet ADL Comments: Pt.  able to independently state sternal precautions.  Pt. demonstrates good safety awareness during ADLs.  He reports he wears compression stockings at home.  Instructed he and wife that she will have to assist him until he is no longer with sternal precautions.   Pt. also inquiring about riding his bicycle.  Instructed him to discuss with MD, but it would be at least 8 weeks, possibly longer    OT Diagnosis:    OT Problem List:   OT Treatment Interventions:     OT Goals    Visit Information  Last OT Received On: 05/05/12 Assistance Needed: +1    Subjective Data  Subjective: "I want to be able to ride my bike again" Patient Stated Goal: "To ride my bike"   Prior Functioning     Home Living Lives With: Spouse Available Help at Discharge: Family;Available 24 hours/day Type of Home: House Home Access: Stairs to enter [employer] of Steps: 5 Entrance Stairs-Rails: Right Home Layout: Two level;Able to live on main level with bedroom/bathroom Bathroom Shower/Tub: Health visitor: Handicapped height Bathroom Accessibility: Yes How Accessible: Accessible via walker Home Adaptive Equipment: Straight cane;Shower chair without back;Grab bars in shower Additional Comments: Wife is very supportive Prior Function Level of Independence: Independent Able to Take Stairs?: Yes Driving: Yes Vocation: Retired Musician: No difficulties Dominant Hand: Right         Vision/Perception     Cognition  Overall Cognitive Status: Appears within functional limits for tasks assessed/performed Arousal/Alertness: Awake/alert Orientation Level: Appears intact for tasks assessed Behavior During Session: [facility] for tasks performed    Extremity/Trunk Assessment Right Upper Extremity Assessment RUE ROM/Strength/Tone: Due to precautions RUE Coordination: WFL - gross/fine motor Left Upper Extremity Assessment LUE ROM/Strength/Tone: Due to precautions LUE  Coordination: WFL - gross/fine motor Trunk Assessment Trunk Assessment: Normal     Mobility Transfers Transfers: Sit  to Stand;Stand to Sit Sit to Stand: 5: Supervision;Without upper extremity assist;From chair/3-in-1 Stand to Sit: 5: Supervision;Without upper extremity assist;To chair/3-in-1 Details for Transfer Assistance: Pt using rocking momentum to get up to minimize use of hands.     Shoulder Instructions     Exercise     Balance     End of Session OT - End of Session Activity Tolerance: Patient tolerated treatment well Patient left: in chair;with call bell/phone within reach;with family/visitor present  GO     Ramiyah Mcclenahan, Ursula Alert M 05/05/2012, 4:41 PM

## 2012-05-05 NOTE — Progress Notes (Signed)
Pt c/o nausea; pt given 4mg IV Zofran at this time; will cont. To monitor. 

## 2012-05-05 NOTE — Progress Notes (Addendum)
Pt c/o seeing black spots and feeling weak; BS is 96 at this time; VSS; PA made aware; no new orders; per PA get pt in bed let rest a while and cont to monitor VS; will cont. To monitor; pt encouraged to drink juice at this time; pt refusing; pt denies need for nausea meds at this time; wife at bedside; will pull EPW after lunch.

## 2012-05-05 NOTE — Progress Notes (Signed)
CARDIAC REHAB PHASE I   PRE:  Rate/Rhythm: 87 SR    BP: sitting 132/60    SaO2: 96 RA  MODE:  Ambulation: 350 ft   POST:  Rate/Rhythm: 105 ST    BP: sitting 150/66     SaO2: 95 RA  Tolerated well. SOB with talking and quick pace. Pace too fast walking, prob trying to keep with rollator (he likes due to gliding smoother). VSS, to recliner. [id number]   [name] CES, ACSM

## 2012-05-05 NOTE — Progress Notes (Signed)
BS now 77; pt still eating; will cont. To monitor.

## 2012-05-05 NOTE — Progress Notes (Signed)
EPW d/c per MD order; pt tolerated well; no oozing noted; bedrest unitl 1415; pt and wife made aware; will cont. To monitor.

## 2012-05-06 ENCOUNTER — Ambulatory Visit: Payer: Medicare Other | Admitting: Internal Medicine

## 2012-05-06 LAB — GLUCOSE, CAPILLARY
Glucose-Capillary: 67 mg/dL — ABNORMAL LOW (ref 70–99)
Glucose-Capillary: 111 mg/dL — ABNORMAL HIGH (ref 70–99)
Glucose-Capillary: 148 mg/dL — ABNORMAL HIGH (ref 70–99)
Glucose-Capillary: 201 mg/dL — ABNORMAL HIGH (ref 70–99)

## 2012-05-06 MED ORDER — FUROSEMIDE 80 MG PO TABS: 80.0000 mg | ORAL_TABLET | Freq: Two times a day (BID) | ORAL | Status: AC

## 2012-05-06 MED ORDER — RAMIPRIL 10 MG PO CAPS: 10.0000 mg | ORAL_CAPSULE | Freq: Every day | ORAL | Status: DC

## 2012-05-06 MED ORDER — GLIMEPIRIDE 2 MG PO TABS
2.0000 mg | ORAL_TABLET | Freq: Every day | ORAL | Status: DC
  Administered 2012-05-07: 2 mg via ORAL
  Filled 2012-05-06 (×3): qty 1

## 2012-05-06 MED ORDER — POTASSIUM CHLORIDE CRYS ER 20 MEQ PO TBCR
40.0000 meq | EXTENDED_RELEASE_TABLET | Freq: Two times a day (BID) | ORAL | Status: DC
  Administered 2012-05-06 (×2): 40 meq via ORAL
  Filled 2012-05-06 (×3): qty 2

## 2012-05-06 MED ORDER — POTASSIUM CHLORIDE CRYS ER 20 MEQ PO TBCR: 40.0000 meq | EXTENDED_RELEASE_TABLET | Freq: Two times a day (BID) | ORAL | Status: AC

## 2012-05-06 MED ORDER — METOPROLOL TARTRATE 25 MG PO TABS: 25.0000 mg | ORAL_TABLET | Freq: Two times a day (BID) | ORAL | Status: DC

## 2012-05-06 MED ORDER — RAMIPRIL 10 MG PO CAPS
10.0000 mg | ORAL_CAPSULE | Freq: Every day | ORAL | Status: DC
  Administered 2012-05-06 – 2012-05-07 (×2): 10 mg via ORAL
  Filled 2012-05-06 (×2): qty 1

## 2012-05-06 MED ORDER — FUROSEMIDE 10 MG/ML IJ SOLN
80.0000 mg | Freq: Four times a day (QID) | INTRAMUSCULAR | Status: AC
  Administered 2012-05-06 (×2): 80 mg via INTRAVENOUS
  Filled 2012-05-06 (×2): qty 8

## 2012-05-06 MED ORDER — FUROSEMIDE 10 MG/ML IJ SOLN
80.0000 mg | Freq: Two times a day (BID) | INTRAMUSCULAR | Status: DC
  Administered 2012-05-06: 80 mg via INTRAVENOUS
  Filled 2012-05-06: qty 8

## 2012-05-06 NOTE — Progress Notes (Addendum)
[address]            [place] [address]          [phone]      6 Days Post-Op Procedure(s) (LRB): CORONARY ARTERY BYPASS GRAFTING (CABG) (N/A) AORTIC VALVE REPLACEMENT (AVR) (N/A) MAZE (N/A)  Subjective: Patient had "low blood sugar" yesterday. Has already walked this am.  Objective: Vital signs in last 24 hours: Patient Vitals for the past 24 hrs:  BP Temp Temp src Pulse Resp SpO2 Weight  05/06/12 0402 146/88 mmHg 98.3 F (36.8 C) Oral 82  20  97 % 230 lb 12.8 oz (104.69 kg)  05/05/12 2031 140/71 mmHg 99 F (37.2 C) Oral 87  18  95 % -  05/05/12 1431 147/77 mmHg - - 82  - - -  05/05/12 1400 124/68 mmHg - - 76  - - -  05/05/12 1345 127/61 mmHg - - 78  - - -  05/05/12 1330 109/79 mmHg - - 78  - - -  05/05/12 1317 113/65 mmHg 98.5 F (36.9 C) Oral 74  18  100 % -  05/05/12 1314 119/60 mmHg - - 78  - - -  05/05/12 1058 132/54 mmHg - - 80  18  100 % -  05/05/12 1047 140/71 mmHg - - 82  - - -  05/05/12 0959 118/60 mmHg - - 92  - - -  05/05/12 0913 - - - 94  - - -   Pre op weight  116.1 kg Current Weight  05/06/12 230 lb 12.8 oz (104.69 kg)      Intake/Output from previous day: 10/14 0701 - 10/15 0700 In: 720 [P.O.:720] Out: 1900 [Urine:1900]   Physical Exam:  Cardiovascular: RRR, no murmurs Pulmonary: Slightly diminished at bases bilaterally;some crackles Abdomen: Soft, non tender, bowel sounds present. Extremities: 2-3+ bilateral lower extremity edema, but slowly decreasing.Ecchymosis of right thigh. Wounds: Clean and dry.  No erythema or signs of infection.  Lab Results: CBC:  Basename 05/05/12 0510 05/04/12 0334  WBC 10.3 11.6*  HGB 8.4* 7.9*  HCT 25.8* 24.0*  PLT 167 131*   BMET:   Basename 05/05/12 0510 05/04/12 0334  NA 139 137  K 3.9 3.5  CL 102 101  CO2 30 29  GLUCOSE 88 133*  BUN 24* 23  CREATININE 0.96 1.03  CALCIUM 8.0* 7.7*    PT/INR:  Lab Results  Component Value Date   INR 1.16 05/06/2012    INR 1.17 05/05/2012   INR 1.17 05/04/2012   ABG:  INR: Will add last result for INR, ABG once components are confirmed Will add last 4 CBG results once components are confirmed  Assessment/Plan:  1. CV - SR.Continue Lopressor 25 bid, Ramipril 5 daily, and Coumadin.  2.  Pulmonary - Encourage incentive spiromete and flutter valve. 3. Volume Overload - Will give lasix IV again today as had better diuresis with this than po 4.  Acute blood loss anemia - H and H increased to 8.4 and 25.8  after 1 unit PRBCs . 5.DM- CBGs 75/147/111.Pre op HGA1C 7.3. Continue Metformin 850 bid.Will hold Glimiperide since CBGs on low side. 6.Hopefully, home in 1-2 days  ZIMMERMAN,DONIELLE MPA-C 05/06/2012,7:14 AM    I have seen and examined the patient and agree with the assessment and plan as outlined.  However, will continue Glimiperide as CBG's yesterday may still have been reflective of  lantus insulin.  Change Ramipril to home dose.  Tentatively plan d/c home tomorrow.  OWEN,CLARENCE H 05/06/2012 7:57 AM

## 2012-05-06 NOTE — Progress Notes (Signed)
1200 Pt has walked twice with staff already today. We will follow up tomorrow for ed. Yoshiko Keleher DunlapRN

## 2012-05-06 NOTE — Progress Notes (Signed)
Pt ambulated to 2300 and back to room with rolling walker and RN; pt assisted to chair; no complaints; pt tolerated walk well; call bell w/i reach; will cont. To monitor.

## 2012-05-06 NOTE — Care Management Note (Unsigned)
    Page 1 of 1   05/06/2012     2:47:37 PM   CARE MANAGEMENT NOTE 05/06/2012  Patient:  Christopher Walker, Christopher Walker   Account Number:  [id number]  Date Initiated:  05/01/2012  Documentation initiated by:  Avie Arenas  Subjective/Objective Assessment:   Post op AVR, CABG x3 and Maze.  Has spouse.     Action/Plan:   Anticipated DC Date:  05/06/2012   Anticipated DC Plan:  HOME W HOME HEALTH SERVICES      DC Planning Services  CM consult      Choice offered to / List presented to:             Status of service:  In process, will continue to follow Medicare Important Message given?   (If response is "NO", the following Medicare IM given date fields will be blank) Date Medicare IM given:   Date Additional Medicare IM given:    Discharge Disposition:    Per UR Regulation:  Reviewed for med. necessity/level of care/duration of stay  If discussed at Long Length of Stay Meetings, dates discussed:    Comments:  Contact:  Colonna,Rela Spouse [phone]  05/05/12 Edan Juday,RN,BSN [phone] PT PROGRESSING WELL S/P AVR, CABG AND MAZE PROCEDURE. REQUESTS RW FOR HOME.  WILL REFER TO [facility] FOR DME NEEDS.

## 2012-05-06 NOTE — Progress Notes (Signed)
Ambulated 300 ft using wheelchair on room air tolerated well. Anquanette Bahner RN

## 2012-05-06 NOTE — Progress Notes (Signed)
Chest tube sutures removed per MD order; steri strips applied to site; one site oozing slightly; will cont. To monitor.

## 2012-05-06 NOTE — Discharge Summary (Signed)
I agree with the above discharge summary and plan for follow-up.  Christopher Walker,Christopher Walker  

## 2012-05-06 NOTE — Progress Notes (Signed)
Physical Therapy Treatment Patient Details Name: Christopher Walker MRN: [id number] DOB: Jul 17, 1933 Today's Date: 05/06/2012 Time: 8295-6213 PT Time Calculation (min): 34 min  PT Assessment / Plan / Recommendation Comments on Treatment Session  Pt s/p AVR and CABG.  Doing well.  Okay for home from PT standpoint.    Follow Up Recommendations  Home health PT;Supervision - Intermittent     Does the patient have the potential to tolerate intense rehabilitation     Barriers to Discharge        Equipment Recommendations  Other (comment) (Rollator)    Recommendations for Other Services    Frequency Min 3X/week   Plan Discharge plan remains appropriate;Frequency remains appropriate    Precautions / Restrictions Precautions Precautions: Sternal;Fall Restrictions Weight Bearing Restrictions: No   Pertinent Vitals/Pain VSS    Mobility  Transfers Sit to Stand: 5: Supervision;With upper extremity assist;From bed;From toilet Stand to Sit: 4: Min assist;With upper extremity assist;To chair/3-in-1;To toilet Details for Transfer Assistance: Pt uses rocking momentum to get up to minimize use of hands Ambulation/Gait Ambulation/Gait Assistance: 5: Supervision Ambulation Distance (Feet): 400 Feet Assistive device: 4-wheeled walker Ambulation/Gait Assistance Details: Verbal cues to stand more erect Gait Pattern: Wide base of support;Trunk flexed;Step-through pattern Stairs: Yes Stairs Assistance: 5: Supervision Stairs Assistance Details (indicate cue type and reason): alternating pattern up and step to pattern down Stair Management Technique: Two rails;Alternating pattern;Step to pattern;Forwards Number of Stairs: 10     Exercises     PT Diagnosis:    PT Problem List:   PT Treatment Interventions:     PT Goals Acute Rehab PT Goals PT Goal: Sit to Stand - Progress: Met PT Goal: Stand to Sit - Progress: Met PT Goal: Ambulate - Progress: Progressing toward goal PT Goal: Up/Down  Stairs - Progress: Met  Visit Information  Last PT Received On: 05/06/12 Assistance Needed: +1    Subjective Data  Subjective: Pt states he wants to go home.   Cognition  Overall Cognitive Status: Appears within functional limits for tasks assessed/performed Arousal/Alertness: Awake/alert Orientation Level: Appears intact for tasks assessed Behavior During Session: [facility] for tasks performed    Balance  Static Standing Balance Static Standing - Balance Support: No upper extremity supported;During functional activity Static Standing - Level of Assistance: 5: Stand by assistance Dynamic Standing Balance Dynamic Standing - Balance Support: No upper extremity supported;During functional activity Dynamic Standing - Level of Assistance: 5: Stand by assistance  End of Session PT - End of Session Equipment Utilized During Treatment: Gait belt Activity Tolerance: Patient tolerated treatment well Patient left: in chair;with call bell/phone within reach Nurse Communication: Mobility status   GP     Cynthia Stainback 05/06/2012, 10:35 AM  [employer] PT [phone]

## 2012-05-07 LAB — GLUCOSE, CAPILLARY: Glucose-Capillary: 154 mg/dL — ABNORMAL HIGH (ref 70–99)

## 2012-05-07 LAB — BASIC METABOLIC PANEL
BUN: 20 mg/dL (ref 6–23)
CO2: 30 mEq/L (ref 19–32)
Calcium: 9 mg/dL (ref 8.4–10.5)
Chloride: 100 mEq/L (ref 96–112)
Creatinine, Ser: 0.97 mg/dL (ref 0.50–1.35)
Glucose, Bld: 149 mg/dL — ABNORMAL HIGH (ref 70–99)

## 2012-05-07 MED ORDER — POTASSIUM CHLORIDE CRYS ER 20 MEQ PO TBCR
40.0000 meq | EXTENDED_RELEASE_TABLET | Freq: Once | ORAL | Status: AC
  Administered 2012-05-07: 40 meq via ORAL

## 2012-05-07 MED ORDER — METOPROLOL TARTRATE 50 MG PO TABS: 50.0000 mg | ORAL_TABLET | Freq: Two times a day (BID) | ORAL | Status: AC

## 2012-05-07 MED ORDER — FUROSEMIDE 10 MG/ML IJ SOLN
80.0000 mg | Freq: Once | INTRAMUSCULAR | Status: AC
  Administered 2012-05-07: 80 mg via INTRAVENOUS
  Filled 2012-05-07: qty 8

## 2012-05-07 NOTE — Clinical Documentation Improvement (Signed)
GENERIC DOCUMENTATION CLARIFICATION QUERY  THIS DOCUMENT IS NOT A PERMANENT PART OF THE MEDICAL RECORD    Please update your documentation within the medical record to reflect your response to this query.                                                                                         05/07/12   Dr. Cornelius Moras and/or Associates,  In a better effort to capture your patient's severity of illness, reflect appropriate length of stay and utilization of resources, a review of the patient medical record has revealed the following indicators:   05/05/12     CHEST - 2 VIEW  Comparison: 05/04/2012; 05/03/2012; 05/02/2012; 04/28/2012  Findings: Grossly unchanged enlarged cardiac silhouette and mediastinal contours post median sternotomy, CABG and aortic valve repair. Interval removal of right jugular approach central venous catheter.  Pulmonary vasculature remains indistinct with cephalization of flow. Minimally improved aeration of the lung bases with persistent bibasilar heterogeneous opacities. Unchanged small bilateral effusions. Vascular calcifications overlie the  upper abdomen. Unchanged bones.  IMPRESSION:    - Mild pulmonary edema and small bilateral effusions.   - Decreased bibasilar opacities, likely atelectasis.  Original Report Authenticated By: Waynard Reeds, M.D.   "Pulmonary: Diminished at bases bilaterally;some crackles I have seen and examined the patient and agree with the assessment and plan as outlined. Still with significant volume overload. Will give lasix IV today." Zahmir Lalla H  05/05/2012      9:04 AM  "Pulmonary: Slightly diminished at bases bilaterally;some crackles Volume Overload - Will give lasix IV again today as had better diuresis with this than po" Virgene Tirone H  05/06/2012      7:57 AM   Based on your clinical judgment, please document in the progress notes and discharge summary if a condition below provides greater specificity regarding "Volume  Overload" that was treated with IV Lasix:   - Acute Pulmonary Edema   - Other Condition   - Unable to Clinically Determine   In responding to this query please exercise your independent judgment.    The fact that a query is asked, does not imply that any particular answer is desired or expected.   Reviewed:  no additional documentation provided  This is normal postop course  Thank You,  Jerral Ralph  RN BSN CCDS Certified Clinical Documentation Specialist: Cell   [phone]  Health Information Management [place]  TO RESPOND TO THE THIS QUERY, FOLLOW THE INSTRUCTIONS BELOW:  1. If needed, update documentation for the patient's encounter via the notes activity.  2. Access this query again and click edit on the In [employer].  3. After updating, or not, click F2 to complete all highlighted (required) fields concerning your review. Select "additional documentation in the medical record" OR "no additional documentation provided".  4. Click Sign note button.  5. The deficiency will fall out of your In Basket *Please let us know if you are not able to complete this workflow by phone or e-mail (listed below).

## 2012-05-07 NOTE — Progress Notes (Addendum)
                   [address]            [place] [address]          [phone]      7 Days Post-Op Procedure(s) (LRB): CORONARY ARTERY BYPASS GRAFTING (CABG) (N/A) AORTIC VALVE REPLACEMENT (AVR) (N/A) MAZE (N/A)  Subjective: Patient's only complaint is taking potassium because it makes his stomach feel upset.  Objective: Vital signs in last 24 hours: Patient Vitals for the past 24 hrs:  BP Temp Temp src Pulse Resp SpO2 Weight  05/07/12 0640 138/75 mmHg 97.9 F (36.6 C) Oral 114  20  95 % 229 lb 8 oz (104.1 kg)  05/06/12 1959 165/70 mmHg 98.6 F (37 C) Oral 97  20  95 % -  05/06/12 1412 132/65 mmHg 97.4 F (36.3 C) Oral 84  18  95 % -  05/06/12 1017 128/55 mmHg - - 95  - - -  05/06/12 2130 - - - 117  - - -  05/06/12 8657 - - - 99  - - -   Pre op weight  116.1 kg Current Weight  05/07/12 229 lb 8 oz (104.1 kg)      Intake/Output from previous day: 10/15 0701 - 10/16 0700 In: 720 [P.O.:720] Out: 2900 [Urine:2900]   Physical Exam:  Cardiovascular: Slightly tachy, no murmurs Pulmonary: Slightly diminished at bases bilaterally;some crackles Abdomen: Soft, non tender, bowel sounds present. Extremities: 2-3+ bilateral lower extremity edema, but slowly decreasing.Ecchymosis of right thigh. Wounds: Clean and dry.  No erythema or signs of infection.  Lab Results: CBC:  Basename 05/05/12 0510  WBC 10.3  HGB 8.4*  HCT 25.8*  PLT 167   BMET:   Basename 05/07/12 0442 05/05/12 0510  NA 142 139  K 4.3 3.9  CL 100 102  CO2 30 30  GLUCOSE 149* 88  BUN 20 24*  CREATININE 0.97 0.96  CALCIUM 9.0 8.0*    PT/INR:  Lab Results  Component Value Date   INR 1.17 05/07/2012   INR 1.16 05/06/2012   INR 1.17 05/05/2012   ABG:  INR: Will add last result for INR, ABG once components are confirmed Will add last 4 CBG results once components are confirmed  Assessment/Plan:  1. CV - HR into 140's yesterday.Now SR.On Lopressor 25 bid, Ramipril 5  daily, and Coumadin. Will increase Lopressor to 50 bid for better HR and BP 2.  Pulmonary - Encourage incentive spiromete and flutter valve. 3. Volume Overload - Will give lasix IV again today as had better diuresis with this than po 4.  Acute blood loss anemia - H and H increased to 8.4 and 25.8  after 1 unit PRBCs . 5.DM- CBGs 141/201/154.Pre op HGA1C 7.3. Continue Metformin 850 bid and Glimiperide 2 daily. 6.Possible discharge later today vs am.  ZIMMERMAN,DONIELLE MPA-C 05/07/2012,7:28 AM      I have seen and examined the patient and agree with the assessment and plan as outlined.  Mr Climer looks good.  Will d/c home today.  Delroy Ordway H 05/07/2012 9:31 AM

## 2012-05-07 NOTE — Progress Notes (Signed)
Pt ambulated 550 feet with rollator and RN; pt assisted to chair once back in room; call bell w/i reach; wife in room; pt for d/c home today.

## 2012-05-07 NOTE — Discharge Summary (Signed)
I agree with the above discharge summary and plan for follow-up.  Latacha Texeira H  

## 2012-05-07 NOTE — Progress Notes (Signed)
HR 104 at this time; pt given 1000 am dose of Metoprolol at this time; will cont. To monitor.

## 2012-05-07 NOTE — Progress Notes (Signed)
1610-9604 Education completed with pt and wife. Understanding voiced. Permission given to refer to [place] Phase 2. Encouraged carb counting and handout given. Put on discharge video for pt and wife to view.Taden Witter DunlapRN

## 2012-05-23 DEATH — deceased

## 2012-05-26 ENCOUNTER — Ambulatory Visit: Payer: Medicare Other

## 2013-01-14 IMAGING — CT CT CHEST W/O CM
3 of 4 series · 16 of 30 positions shown, 17 images · non-contrast
Comparison: None.

CLINICAL DATA: Preop aortic valve surgery.  Evaluate ascending
aorta.

CT CHEST WITHOUT CONTRAST
TECHNIQUE: Multidetector CT imaging of the chest was performed
following the standard protocol without IV contrast.

[Series 3: chest w/o · axial · non-contrast · 0.89mm/px · z∈[-248,-58]mm · 4 of 64 slices shown, 5 images]
[im 13/64  mediastinal]
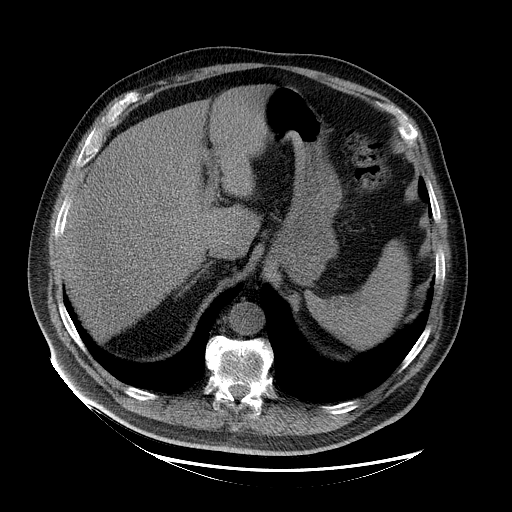
[im 13/64  lung]
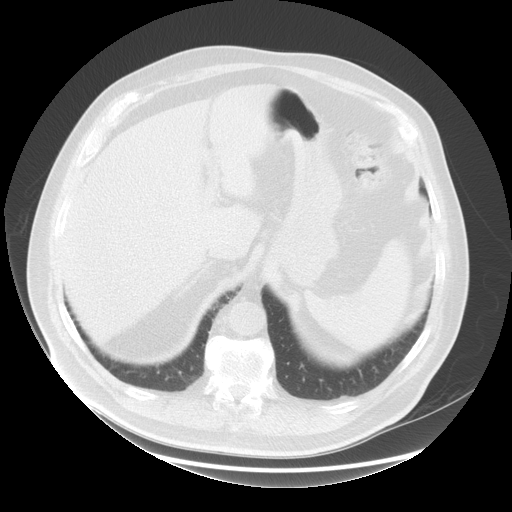
[im 26/64  lung]
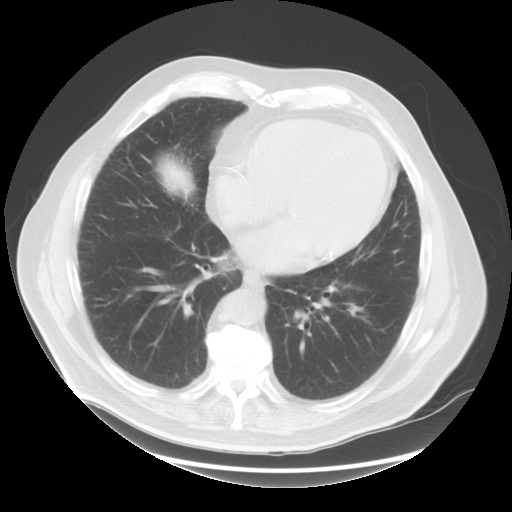
[im 38/64  lung]
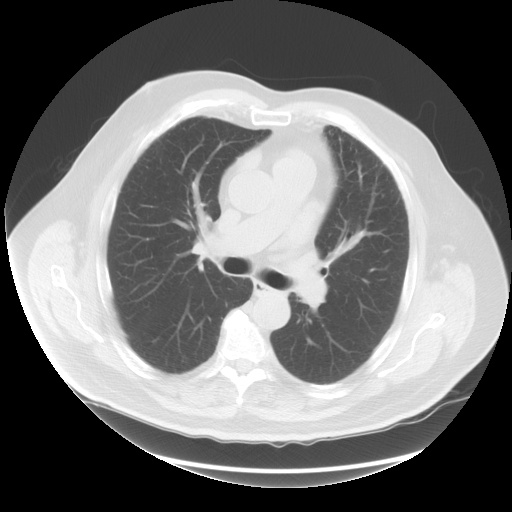
[im 51/64  lung]
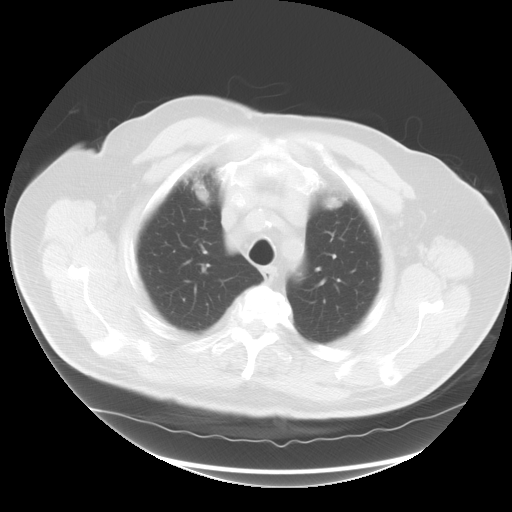

[Series 4: lung windows · axial · 0.89mm/px · z∈[-248,-58]mm · 4 of 64 slices shown]
[im 13/64  lung]
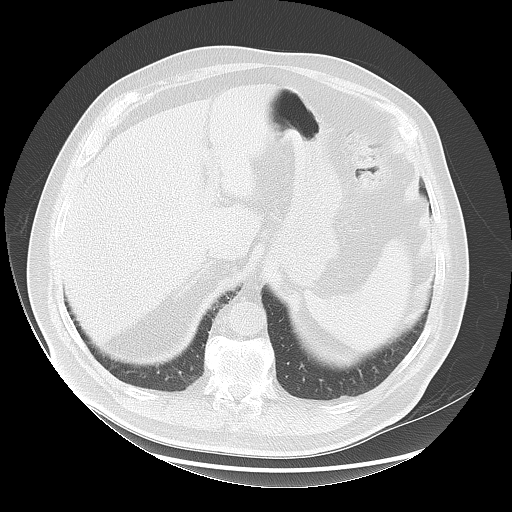
[im 26/64  lung]
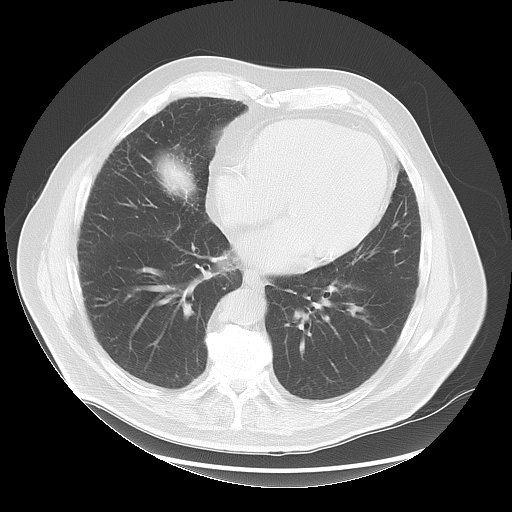
[im 38/64  lung]
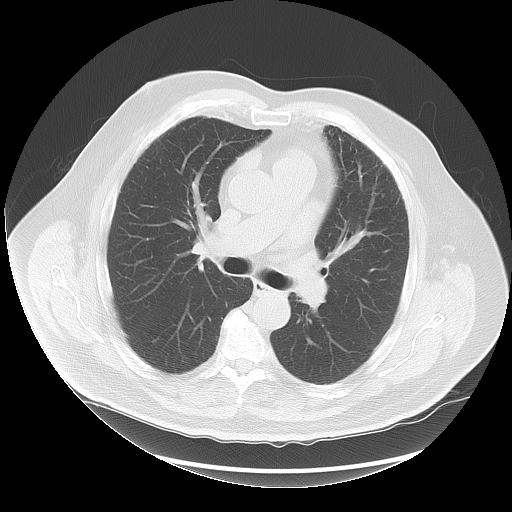
[im 51/64  lung]
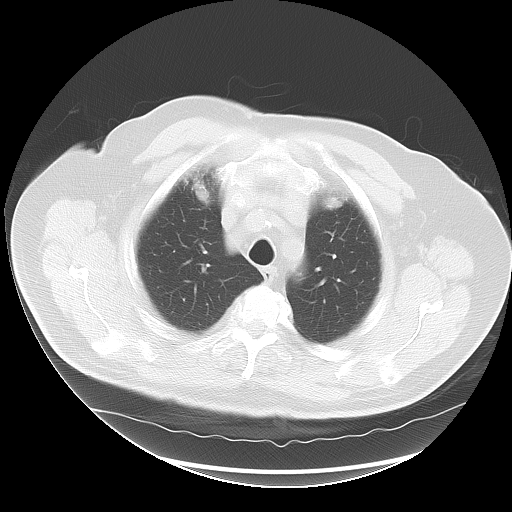

[Series 602: sagittal body · sagittal · 0.89mm/px · 8 of 181 slices shown]
[im 13/181  mediastinal]
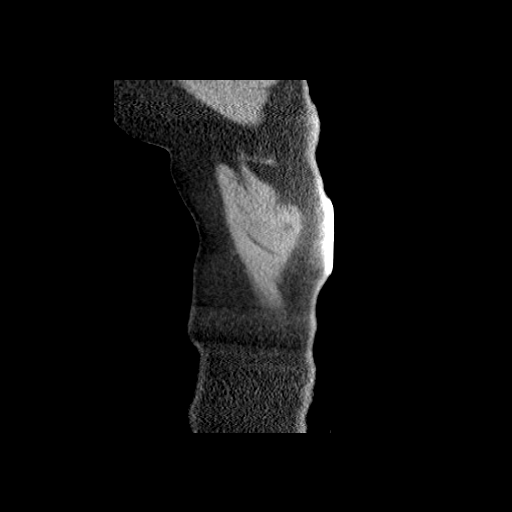
[im 37/181  mediastinal]
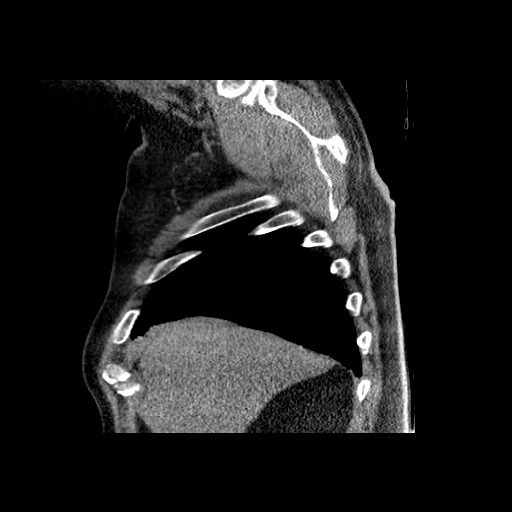
[im 61/181  mediastinal]
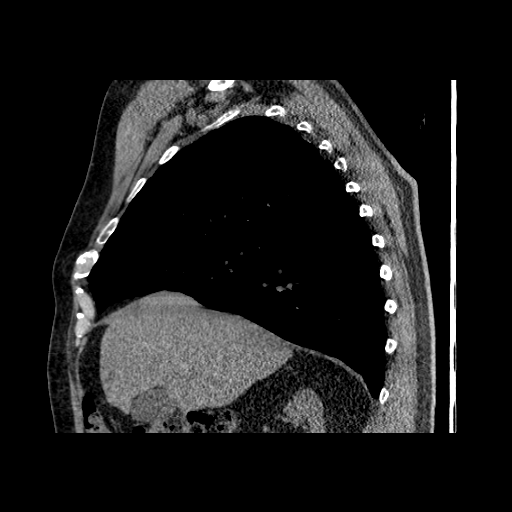
[im 85/181  mediastinal]
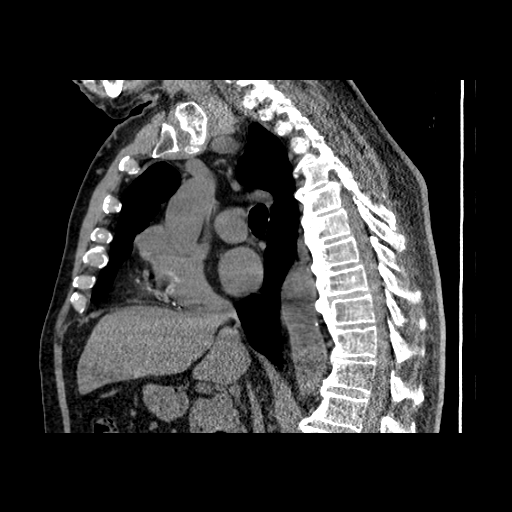
[im 97/181  mediastinal]
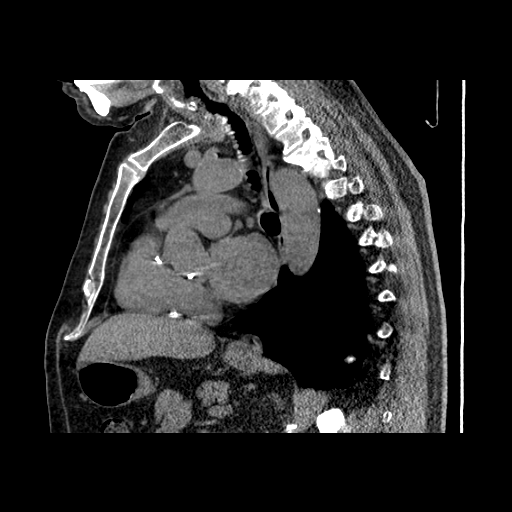
[im 121/181  mediastinal]
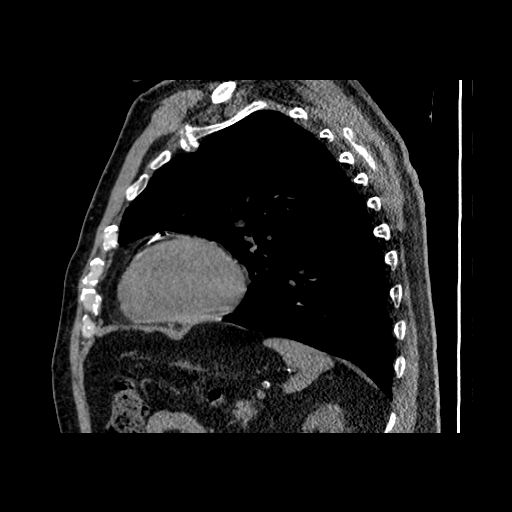
[im 145/181  mediastinal]
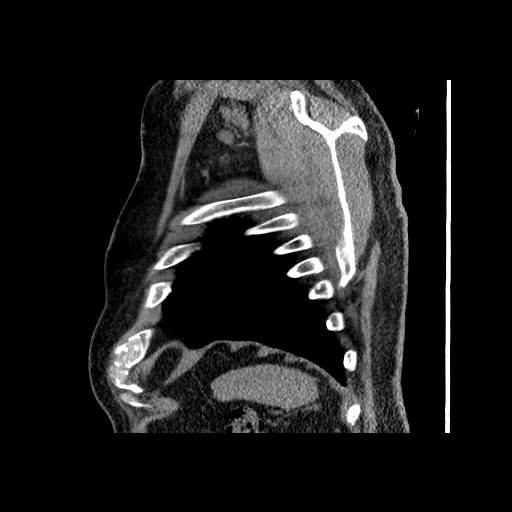
[im 169/181  mediastinal]
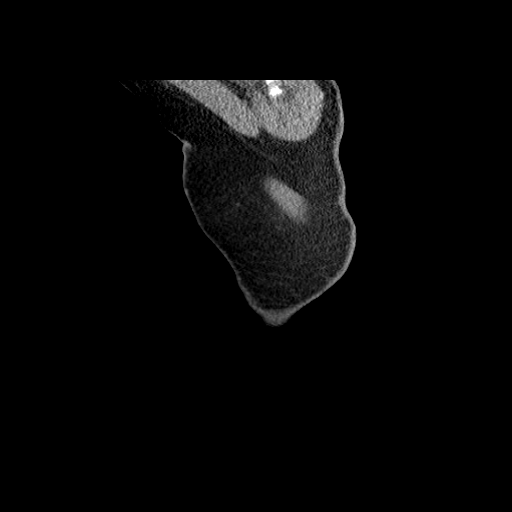

[16 of 30 positions shown; findings below may reference images not displayed]

FINDINGS: No pathologically enlarged mediastinal or axillary lymph
nodes.  Hilar regions are difficult to definitively evaluate
without IV contrast.  There are aortic valvular calcifications.
Ascending aorta measures up to 3.8 cm.  Transverse descending
thoracic aorta are normal in caliber.  Extensive coronary artery
calcification.  Heart is at the upper limits of normal in size.  No
pericardial effusion. Pulmonary arteries are enlarged.  Tiny hiatal
hernia.

There is very mild subpleural reticulation bilaterally.  Right
lower lobe nodule measures 9 x 6 mm.  Additional scattered small
subpleural nodular densities are noted.  No pleural fluid.  Airway
is unremarkable.

Incidental imaging of the upper abdomen shows a 2.0 x 2.3 cm low
attenuation nodule in the right adrenal gland, measuring -6
Hounsfield units.  Duodenal diverticulum is incidentally noted.  A
fair amount of stool is seen in the visualized colon.  No worrisome
lytic or sclerotic lesions.  Degenerative changes are seen in the
spine.
IMPRESSION: 1.  Aortic valvular calcifications without ascending aortic
aneurysm
2.  Extensive coronary artery calcification and pulmonary arterial
hypertension.
3.  Mild subpleural pulmonary fibrosis.
4.  Right lower lobe nodule. If the patient is at high risk for
bronchogenic carcinoma, follow-up chest CT at 3-6 months is
recommended.  If the patient is at low risk for bronchogenic
carcinoma, follow-up chest CT at 6-12 months is recommended.  This
recommendation follows the consensus statement: Guidelines for
Management of Small Pulmonary Nodules Detected on CT Scans: A
Statement from the [HOSPITAL] as published in Radiology

## 2013-01-19 IMAGING — CR DG CHEST 1V PORT
1 series · 1 of 1 positions shown · non-contrast
Comparison: Yesterday

CLINICAL DATA: Cardiac surgery

PORTABLE CHEST - 1 VIEW

[AP]
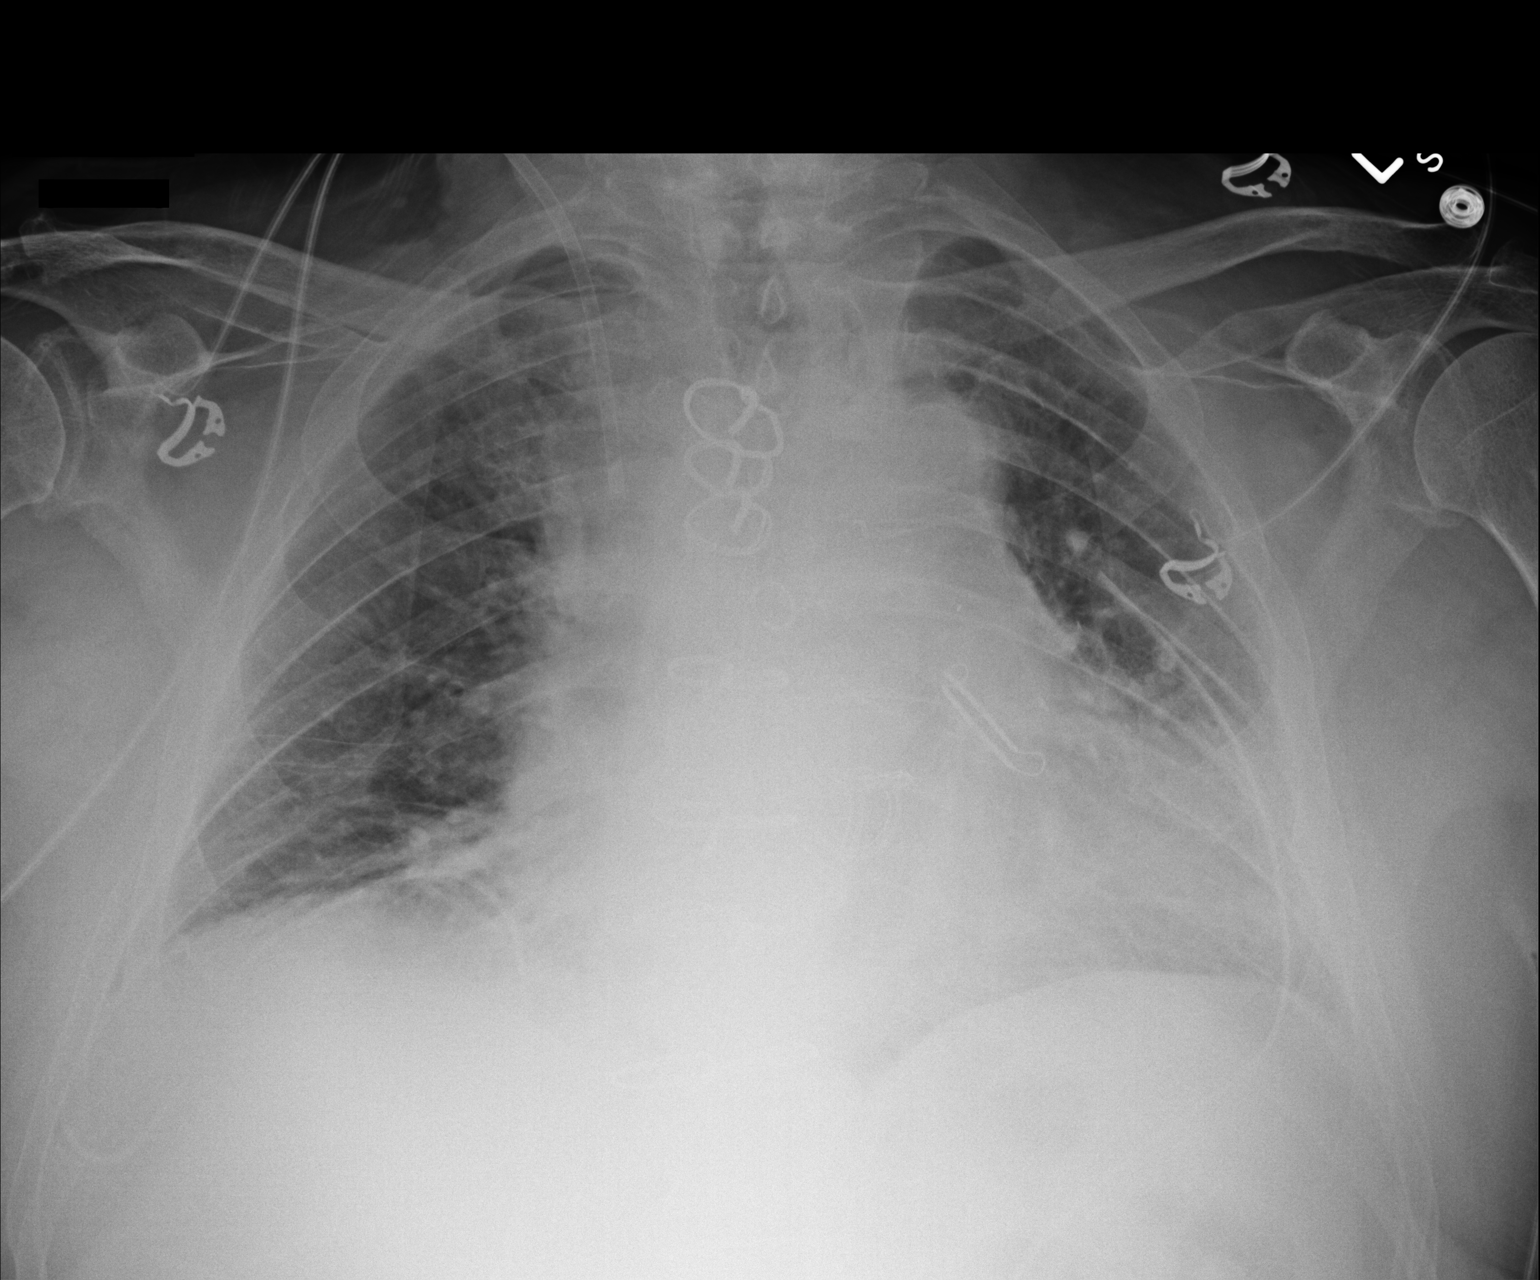

[1 of 1 positions shown; findings below may reference images not displayed]

FINDINGS: Right neck introducer sheath stable. One chest tube
removed with the other left in place.  Cardiomegaly persists.
Vascular congestion improved.  Bibasilar atelectasis unchanged.
Atrial appendage staple stable.  Linear opacity at the right base
may represent an additional chest tube. Mediastinum remains
widened.
IMPRESSION: Stable bibasilar atelectasis.  One chest tube removed without
ensuing pneumothorax.

## 2013-01-21 IMAGING — CR DG CHEST 2V
2 series · 2 of 2 positions shown · non-contrast
Comparison: 05/04/2012; 05/03/2012; 05/02/2012; 04/28/2012

CLINICAL DATA: Post CABG, evaluate for CHF

CHEST - 2 VIEW

[w chest pa]
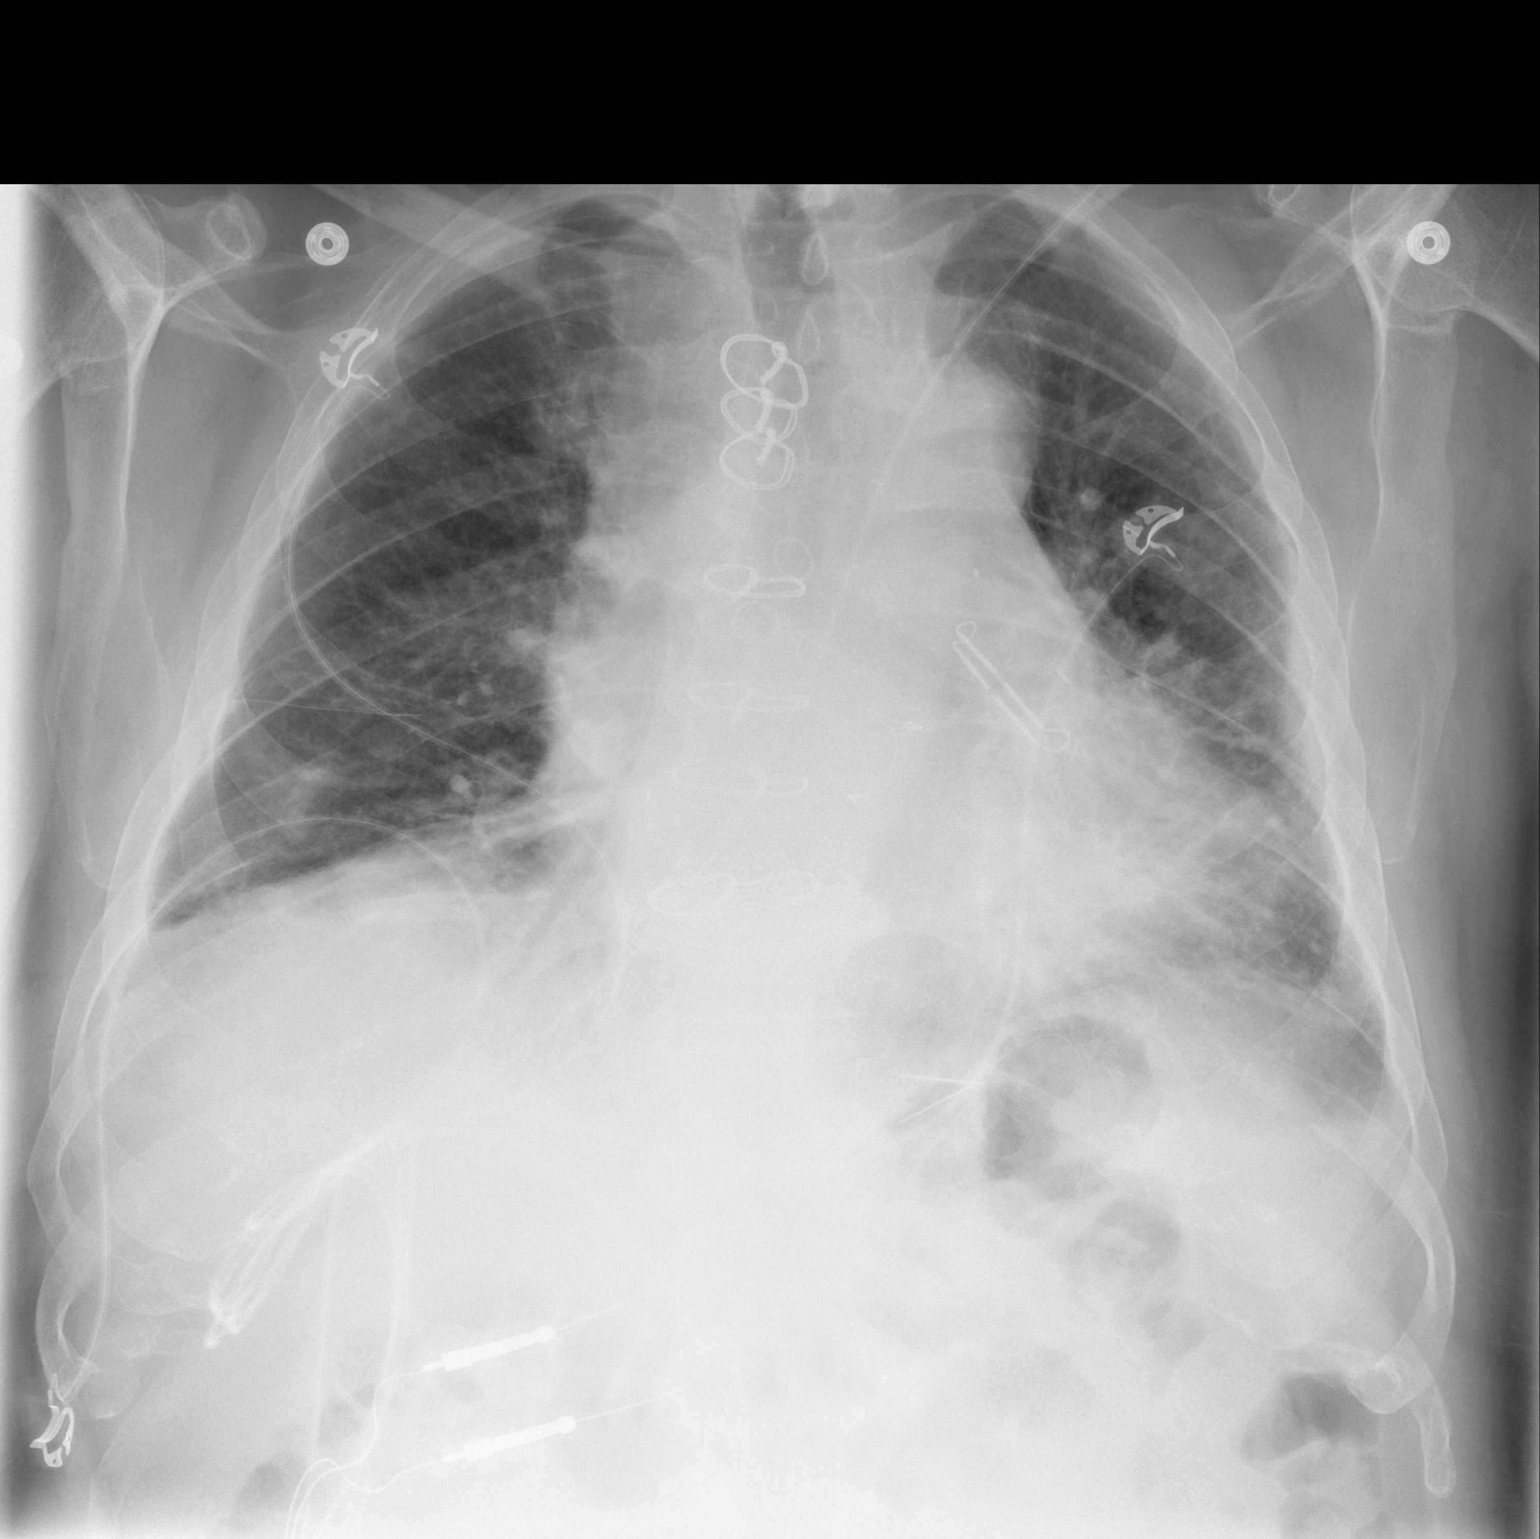

[w chest lat]
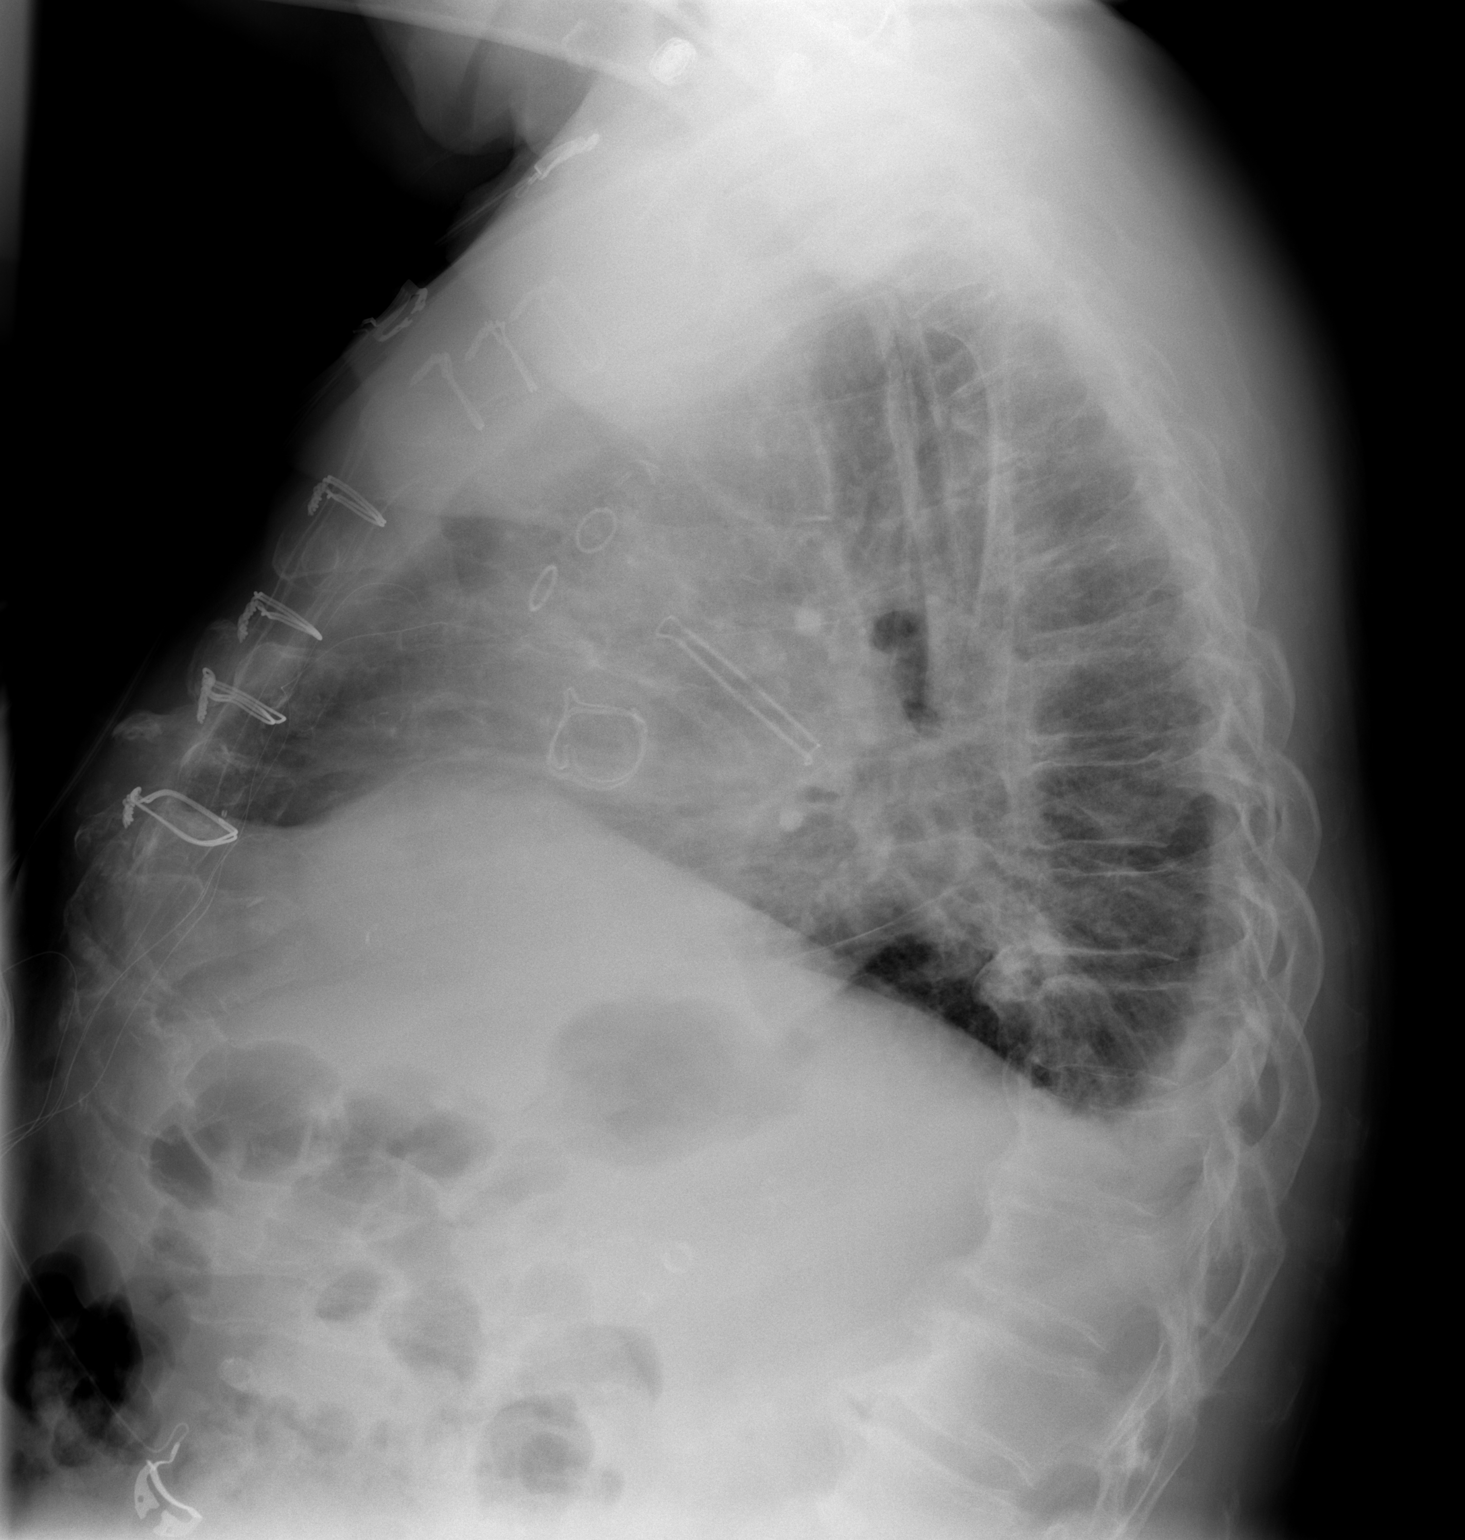

[2 of 2 positions shown; findings below may reference images not displayed]

FINDINGS: Grossly unchanged enlarged cardiac silhouette and
mediastinal contours post median sternotomy, CABG and aortic valve
repair.  Interval removal of right jugular approach central venous
catheter. Increased conspicuity of likely unchanged tiny right
apical pneumothorax.  Pulmonary vasculature remains indistinct with
cephalization of flow.  Minimally improved aeration of the lung
bases with persistent bibasilar heterogeneous opacities. Unchanged
small bilateral effusions.  Vascular calcifications overlie the
upper abdomen.  Unchanged bones.
IMPRESSION: 1.  Increased conspicuity of likely unchanged tiny right apical
pneumothorax.
2.  Mild pulmonary edema and small bilateral effusions.
3.  Decreased bibasilar opacities, likely atelectasis.

## 2014-11-09 NOTE — Op Note (Signed)
PATIENT NAME:  MCCRAE, SPECIALE MR#:  [id number] DATE OF BIRTH:  21-May-1933  DATE OF PROCEDURE:  03/20/2012  PREPROCEDURE DIAGNOSIS: Atrial fibrillation.   PROCEDURE: Cardioversion.   POSTPROCEDURE DIAGNOSIS: Atrial sensing with sinus rhythm.   INDICATION:  The patient is a 81 year old gentleman with known two-vessel coronary artery disease, congestive heart failure, and ischemic cardiomyopathy. The patient has been in atrial fibrillation and is now considered for elective ICD. The procedure, risks, benefits, and alternatives of electrocardioversion were explained to the patient and informed written consent was obtained.   DESCRIPTION OF PROCEDURE: He was brought to the special holding area in a fasting state. The patient received 40 mg of intravenous Diprivan. Cardioversion was performed at 100 joules with conversion to sinus bradycardia with a rate of 53 bpm. There were no complications.     ____________________________ Isaias Cowman, MD ap:bjt D: 03/20/2012 07:59:59 ET T: 03/20/2012 09:54:08 ET JOB#: [id number]  cc: Isaias Cowman, MD, <Dictator> Isaias Cowman MD ELECTRONICALLY SIGNED 03/26/2012 9:00

## 2014-11-14 NOTE — H&P (Signed)
PATIENT NAME:  Christopher Walker, Christopher Walker MR#:  [id number] DATE OF BIRTH:  09-25-1932  DATE OF ADMISSION:  11/22/2011  PRIMARY CARE PHYSICIAN: Dr. Gilford Rile  CHIEF COMPLAINT: Short of breath.   HISTORY OF PRESENT ILLNESS: The patient is a 81 year old gentleman with known two vessel coronary artery disease and aortic stenosis. The patient recently has been experiencing weakness, fatigue, and shortness of breath. He was seen on 11/19/2011 at which time the patient was noted to be in atrial fibrillation with signs and symptoms of congestive heart failure. Furosemide was increased to 40 mg a day. The patient did not experience any significant diuresis, although he lost 3 pounds. The patient continues to experience shortness of breath with exertion and at rest. Recent echocardiogram on 10/26/2011 revealed LVF of 30-35% with a calculated aortic valve area 0.78 sq cm with a mean gradient of 25 mmHg and a peak velocity of 3.2 m/sec.   PAST MEDICAL HISTORY:  1. Two vessel coronary artery disease with occluded distal RCA and diffuse 80% stenosis of LAD treated medically. 2. Aortic stenosis.  3. Hyperlipidemia.  4. Hypertension.  5. Diabetes.   MEDICATIONS:  1. Aspirin 81 mg daily.  2. Furosemide 40 mg daily.  3. Metoprolol succinate 25 mg daily.  4. Ramipril 10 mg b.i.d.  5. Simvastatin 40 mg daily.  6. Metformin 850 mg b.i.d.   SOCIAL HISTORY: The patient is married. He is retired. He quit tobacco use 37 years ago.   FAMILY HISTORY: No immediate family history of coronary artery disease or myocardial infarction.   REVIEW OF SYSTEMS: CONSTITUTIONAL: No fever or chills. EYES: No blurry vision. EARS: No hearing loss. RESPIRATORY: Shortness of breath as described above. CARDIOVASCULAR: The patient does have shortness of breath and orthopnea. GASTROINTESTINAL: No nausea, vomiting, or diarrhea. GU: No dysuria or hematuria. ENDOCRINE: No polyuria or polydipsia. MUSCULOSKELETAL: No arthralgias or myalgias.  NEUROLOGICAL: No focal muscle weakness or numbness.   PHYSICAL EXAMINATION:  VITAL SIGNS:,: Weight 277, height 6 feet. Blood pressure 138/80 right arm, 132/82 left arm. Pulse 112 and irregular irregular.   HEENT: Pupils equal and reactive to light and accommodation.   NECK: Supple without thyromegaly.   LUNGS: Clear.   HEART: Normal jugular venous pressure. Diffuse point of maximal impulse. Irregular, irregular rhythm. Normal S1, S2. No appreciable gallop, murmur, or rub.   ABDOMEN: Soft and nontender. Pulses were intact bilaterally.   MUSCULOSKELETAL: Normal muscle tone.   NEUROLOGIC: The patient is alert and oriented x3. Motor and sensory both grossly intact.   IMPRESSION: This is a 81 year old gentleman with known coronary artery disease, possible severe aortic stenosis, though this may represent pseudo aortic stenosis in the setting of decreased left ventricular function with recent onset of atrial fibrillation and congestive heart failure who has not responded to Lasix therapy.   RECOMMENDATIONS:  1. Admit to telemetry.  2. Start furosemide IV 40 mg q. 8 hours.  3. Cycle cardiac enzymes.  4. Daily weights.  5. Follow I's and O's.  6. Follow BUN and creatinine closely.  7. Pending the patient's clinical course, may consider cardiac catheterization early next week.  ____________________________ Isaias Cowman, MD ap:rbg D: 11/22/2011 12:17:42 ET T: 11/22/2011 13:03:54 ET JOB#: [id number]  cc: Isaias Cowman, MD, <Dictator> Isaias Cowman MD ELECTRONICALLY SIGNED 11/25/2011 9:39

## 2014-11-14 NOTE — Consult Note (Signed)
PATIENT NAME:  Christopher Walker, Christopher Walker MR#:  [id number] DATE OF BIRTH:  Feb 03, 1933  DATE OF CONSULTATION:  11/25/2011  CONSULTING PHYSICIAN:  Rusty Aus, MD  REASON FOR CONSULTATION: Mr. Melander is a 81 year old male who presents with mild hypoxemia. He was admitted with congestive heart failure and AS.  Enzymes were negative. He IV diuresed well. Sugars are reasonably controlled. Aortic valve area according to the last echo was 1 sq cm. He is going for catheterization tomorrow. Pulse oximetry this morning was noted at 85% on room air oxygen, oxygenated he went to 98%. He has no dyspnea, no chest pain, no shortness of breath. He is basically asymptomatic. He has a lot of postnasal drainage. He again has no symptoms really whatsoever, no tachycardia.   PAST MEDICAL HISTORY:  1. Aortic stenosis. 2. Diabetes mellitus, type 2. 3. Coronary artery disease.  4. Polymyalgia rheumatica.   PAST SURGICAL HISTORY:  1. Hemorrhoidectomy.  2. Cataracts.   ALLERGIES: Avandia.   MEDICATIONS:  1. Aspirin 81 mg daily.  2. Ramipril 10 mg b.i.d.  3. Simvastatin 40 mg at bedtime.  4. Metoprolol 50 mg b.i.d.  5. Furosemide 20 mg IV daily. 6. Sliding scale insulin.  7. Nasonex 2 sprays daily.   SOCIAL HISTORY: He is married, retired. He stopped smoking 37 years ago.   FAMILY HISTORY: One brother with coronary disease.   REVIEW OF SYSTEMS: Review of systems, as above, otherwise negative.   PHYSICAL EXAMINATION:  VITAL SIGNS: Blood pressure 135/75, pulse 98 and regular.   NECK: No JVD.   LUNGS: Clear. No consolidation, rales, wheezes or rhonchi.   HEART: Regular rhythm, A 3/6 systolic murmur at the right upper sternal border.   ABDOMEN: Soft and nontender.   EXTREMITIES: 2+ dorsal pedal pulses, no edema.   LABORATORY DATA: Reported pulse oximetry 85% on room air, but rechecking on this. Electrolytes are stable   ASSESSMENT AND PLAN:  1. Mild hypoxemia: No clinical indication for PE, totally  asymptomatic. We will take off the oxygen and re-evaluate. He is a bit volume depleted, and we will lower his IV Lasix, lower the IV fluid rate. That may be playing somewhat into this. We will use Nasonex two sprays daily. We will give Lovenox x1. Any change or progression, we will need to look further into this. Chest x-ray is pending.  2. Diabetes: Sliding scale.  3. AS: Catheterization tomorrow.  ____________________________ Rusty Aus, MD mfm:cbb D: 11/25/2011 11:38:34 ET T: 11/25/2011 12:30:40 ET JOB#: [id number]  cc: Rusty Aus, MD, <Dictator> Isaias Cowman, MD Rusty Aus MD ELECTRONICALLY SIGNED 11/29/2011 8:02

## 2014-11-14 NOTE — Discharge Summary (Signed)
PATIENT NAME:  Christopher Walker, Christopher Walker MR#:  [id number] DATE OF BIRTH:  June 14, 1933  DATE OF ADMISSION:  11/22/2011 DATE OF DISCHARGE:  11/27/2011  ADDENDUM  [name] was admitted on 05/02/213 with acute on chronic congestive heart failure.  ____________________________ Isaias Cowman, MD ap:cms D: 11/28/2011 08:36:00 ET T: 11/28/2011 12:04:06 ET JOB#: [id number]  cc: Isaias Cowman, MD, <Dictator> Isaias Cowman MD ELECTRONICALLY SIGNED 11/29/2011 10:59

## 2014-11-14 NOTE — Discharge Summary (Signed)
PATIENT NAME:  Christopher Walker, BUERKLE MR#:  [id number] DATE OF BIRTH:  [birth year]  DATE OF ADMISSION:  11/22/2011 DATE OF DISCHARGE:  11/27/2011  PRIMARY CARE PHYSICIAN: Dr. Gilford Rile   FINAL DIAGNOSES: 1. Congestive heart failure.  2. Coronary artery disease.  3. Hyperlipidemia.  4. Hypertension.  5. Diabetes.   DISCHARGE MEDICATIONS:  1. Aspirin 81 mg daily.  2. Furosemide 40 mg daily.  3. Metoprolol succinate 100 mg daily.  4. Ramipril 10 mg b.i.d.  5. Nexium 40 mg daily p.r.n.  6. Metformin 850 mg b.i.d.  7. Zinc 50 mg daily.  8. Os-Cal 750 mg plus vitamin D 400 international units b.i.d.  9. Vicodin 1 q.4 p.r.n.  10. Ultracet 1 tab daily p.r.n.   PROCEDURE: Cardiac catheterization with selective coronary arteriography on 11/26/2011.   HISTORY OF PRESENT ILLNESS: Please see admission history and physical.   HOSPITAL COURSE: The patient was admitted on 11/22/2011 with progressive congestive heart failure manifested with worsening shortness of breath and pedal edema. The patient underwent diuresis with intravenous furosemide 40 mg q.8 hours with resulted in diuresis and clinical improvement. Patient's renal status remained stable. He underwent cardiac catheterization on 11/26/2011. Coronary arteriography revealed 60% stenosis in a calcified distal LAD, 50% stenosis in the proximal right coronary artery, 50% stenosis in first obtuse marginal branch, and occluded posterior descending artery with marked severe dilated cardiomyopathy with LVEF of 24%. No significant gradient was noted on simultaneous aortic and left ventricular pressures. It was felt the patient did not have significant aortic stenosis. Overall the patient appeared to have severe dilated cardiomyopathy well out of proportion to underlying coronary artery disease. Medical therapy was recommended. On the morning of 11/27/2011 the patient was doing well, ambulating without difficulty and is discharged home. He is scheduled to see me  in follow up in one week.   ____________________________ Isaias Cowman, MD ap:cms D: 11/27/2011 08:19:04 ET T: 11/27/2011 12:33:34 ET JOB#: [id number]  cc: Isaias Cowman, MD, <Dictator> Isaias Cowman MD ELECTRONICALLY SIGNED 11/29/2011 10:59
